# Patient Record
Sex: Female | Born: 1980
Health system: Southern US, Community
[De-identification: ages and names within clinical notes are randomized; demographics above are authoritative.]

## PROBLEM LIST (undated history)

## (undated) DIAGNOSIS — N760 Acute vaginitis: Secondary | ICD-10-CM

## (undated) DIAGNOSIS — N83209 Unspecified ovarian cyst, unspecified side: Secondary | ICD-10-CM

## (undated) DIAGNOSIS — B9689 Other specified bacterial agents as the cause of diseases classified elsewhere: Secondary | ICD-10-CM

## (undated) HISTORY — DX: Acute vaginitis: N76.0

## (undated) HISTORY — DX: Unspecified ovarian cyst, unspecified side: N83.209

## (undated) HISTORY — PX: LAPAROSCOPY: SHX197

## (undated) HISTORY — PX: APPENDECTOMY: SHX54

## (undated) HISTORY — DX: Other specified bacterial agents as the cause of diseases classified elsewhere: B96.89

## (undated) HISTORY — PX: WISDOM TOOTH EXTRACTION: SHX21

---

## 2014-12-22 DIAGNOSIS — R0789 Other chest pain: Secondary | ICD-10-CM | POA: Insufficient documentation

## 2015-09-09 DIAGNOSIS — T192XXA Foreign body in vulva and vagina, initial encounter: Secondary | ICD-10-CM | POA: Diagnosis not present

## 2015-09-11 MED FILL — PHENTERMINE 30 MG CAPSULE: 30 | 30 days supply | Qty: 30 | Fill #0

## 2015-09-11 MED FILL — metroNIDAZOLE 500 MG TABS: 500 | 7 days supply | Qty: 14 | Fill #0

## 2015-10-08 MED FILL — metroNIDAZOLE 500 MG TABS: 500 | 7 days supply | Qty: 14 | Fill #1

## 2015-11-02 MED FILL — metroNIDAZOLE 500 MG TABS: 500 | 7 days supply | Qty: 14 | Fill #2

## 2015-12-21 MED FILL — metroNIDAZOLE 500 MG TABS: 500 | 7 days supply | Qty: 14 | Fill #3

## 2016-01-21 ENCOUNTER — Encounter: Payer: Self-pay | Admitting: Obstetrics and Gynecology

## 2016-03-01 ENCOUNTER — Encounter: Payer: Self-pay | Admitting: Obstetrics and Gynecology

## 2016-03-01 ENCOUNTER — Ambulatory Visit (INDEPENDENT_AMBULATORY_CARE_PROVIDER_SITE_OTHER): Payer: 59 | Admitting: Obstetrics and Gynecology

## 2016-03-01 VITALS — BP 108/72 | HR 91 | Ht 67.0 in | Wt 211.9 lb

## 2016-03-01 DIAGNOSIS — Z8742 Personal history of other diseases of the female genital tract: Secondary | ICD-10-CM | POA: Insufficient documentation

## 2016-03-01 DIAGNOSIS — E669 Obesity, unspecified: Secondary | ICD-10-CM | POA: Diagnosis not present

## 2016-03-01 DIAGNOSIS — Z3049 Encounter for surveillance of other contraceptives: Secondary | ICD-10-CM

## 2016-03-01 DIAGNOSIS — N946 Dysmenorrhea, unspecified: Secondary | ICD-10-CM | POA: Insufficient documentation

## 2016-03-01 DIAGNOSIS — N920 Excessive and frequent menstruation with regular cycle: Secondary | ICD-10-CM

## 2016-03-01 DIAGNOSIS — Z9889 Other specified postprocedural states: Secondary | ICD-10-CM | POA: Diagnosis not present

## 2016-03-01 DIAGNOSIS — Z6833 Body mass index (BMI) 33.0-33.9, adult: Secondary | ICD-10-CM | POA: Insufficient documentation

## 2016-03-01 DIAGNOSIS — Z8619 Personal history of other infectious and parasitic diseases: Secondary | ICD-10-CM | POA: Insufficient documentation

## 2016-03-01 DIAGNOSIS — R102 Pelvic and perineal pain: Secondary | ICD-10-CM | POA: Diagnosis not present

## 2016-03-01 DIAGNOSIS — Z113 Encounter for screening for infections with a predominantly sexual mode of transmission: Secondary | ICD-10-CM | POA: Insufficient documentation

## 2016-03-01 DIAGNOSIS — Z3040 Encounter for surveillance of contraceptives, unspecified: Secondary | ICD-10-CM | POA: Insufficient documentation

## 2016-03-01 DIAGNOSIS — Z01419 Encounter for gynecological examination (general) (routine) without abnormal findings: Secondary | ICD-10-CM | POA: Diagnosis not present

## 2016-03-01 LAB — POCT URINALYSIS DIPSTICK
Bilirubin, UA: NEGATIVE
GLUCOSE UA: NEGATIVE
Ketones, UA: NEGATIVE
LEUKOCYTES UA: NEGATIVE
NITRITE UA: NEGATIVE
PH UA: 6
Protein, UA: NEGATIVE
RBC UA: NEGATIVE
Spec Grav, UA: 1.02
UROBILINOGEN UA: NEGATIVE

## 2016-03-01 NOTE — Addendum Note (Signed)
Addended by: Marchelle FolksMILLER, Zaidyn Claire G on: 03/01/2016 04:57 PM   Modules accepted: Orders

## 2016-03-01 NOTE — Progress Notes (Signed)
ANNUAL PREVENTATIVE CARE GYN  ENCOUNTER NOTE  Subjective:       Lisa Norman is a 35 y.o. G0P0000 female here for a routine annual gynecologic exam.  Current complaints:  1.  Recurrent bv: history of recurrent bacterial vaginosis treated with MetroGel, Flagyl by mouth, and alternating regimen of MetroGel 1 week followed by Flagyl by mouth 1 week followed by MetroGel the third week without resolution of symptomatology. Patient reports occasional douching. She does use condoms for contraception, and in particular lambskin condoms. She is uncertain if the prophylactic is contributing to the symptoms or semen or just pH changes that fluctuate.  2. Change in menses: Since age 35 patient has noted cycles to the heavier and associated with more clots. She notes increasing dysmenorrhea requiring ibuprofen 800 mg twice a day. Pelvic cramps are described as central cramping along with low back pain associated with radiation into the buttocks and thighs. She does not report any deep thrusting dyspareunia. Patient does have 2 sisters who also have painful periods. There is not: Diagnosis of endometriosis in the family There is a family history of stroke in mom who had first stroke at age 37 and subsequently had 2 brothers. She also has maternal aunt with a stroke as well as maternal grandmother. She is not inclined to use hormonal contraception.  3. Remote history of PID diagnosed at age 35; status post laparotomy, suspected appendicitis, but actually finding PID; patient was counseled back: Regarding potential impact on chronic pelvic pain and or infertility. She has intermittently not use contraception without conceiving. Currently she is with a monogamous partner     Gynecologic History Patient's last menstrual period was 02/21/2016 (approximate). Contraception: condoms Last Pap: 2016. Results were: normal Last mammogram: n/a.   Obstetric History OB History  Gravida Para Term Preterm AB Living   0 0 0 0 0 0  SAB TAB Ectopic Multiple Live Births  0 0 0 0 0        Past Medical History:  Diagnosis Date  . Bacterial vaginosis   . Ovarian cyst     Past Surgical History:  Procedure Laterality Date  . LAPAROSCOPY      No current outpatient prescriptions on file prior to visit.   No current facility-administered medications on file prior to visit.     No Known Allergies  Social History   Social History  . Marital status: Single    Spouse name: N/A  . Number of children: N/A  . Years of education: N/A   Occupational History  . Not on file.   Social History Main Topics  . Smoking status: Not on file  . Smokeless tobacco: Not on file  . Alcohol use Not on file  . Drug use: Unknown  . Sexual activity: Not on file   Other Topics Concern  . Not on file   Social History Narrative  . No narrative on file    No family history on file.  The following portions of the patient's history were reviewed and updated as appropriate: allergies, current medications, past family history, past medical history, past social history, past surgical history and problem list.  Review of Systems ROS Review of Systems - General ROS: negative for - chills, fatigue, fever, hot flashes, night sweats. POSITIVE-weight gain Psychological ROS: negative for - anxiety, decreased libido, depression, mood swings, physical abuse or sexual abuse Ophthalmic ROS: negative for - blurry vision, eye pain or loss of vision ENT ROS: negative for - headaches, hearing  change, visual changes or vocal changes Allergy and Immunology ROS: negative for - hives, itchy/watery eyes or seasonal allergies Hematological and Lymphatic ROS: negative for - bleeding problems, bruising, swollen lymph nodes or weight loss Endocrine ROS: negative for - galactorrhea, hair pattern changes, hot flashes, malaise/lethargy, mood swings, palpitations, polydipsia/polyuria, skin changes Breast ROS: negative for - new or changing  breast lumps or nipple discharge Respiratory ROS: negative for - cough or shortness of breath Cardiovascular ROS: negative for - chest pain, irregular heartbeat, palpitations or shortness of breath Gastrointestinal ROS: no abdominal pain, change in bowel habits, or black or bloody stools Genito-Urinary ROS: no dysuria, trouble voiding, or hematuria Musculoskeletal ROS: negative for - joint pain or joint stiffness Neurological ROS: negative for - bowel and bladder control changes Dermatological ROS: negative for rash and skin lesion changes   Objective:   BP 108/72   Pulse 91   Ht 5\' 7"  (1.702 m)   Wt 211 lb 14.4 oz (96.1 kg)   LMP 02/21/2016 (Approximate)   BMI 33.19 kg/m  CONSTITUTIONAL: Well-developed, well-nourished female in no acute distress.  PSYCHIATRIC: Normal mood and affect. Normal behavior. Normal judgment and thought content. NEUROLGIC: Alert and oriented to person, place, and time. Normal muscle tone coordination. No cranial nerve deficit noted. HENT:  Normocephalic, atraumatic, External right and left ear normal. Oropharynx is clear and moist EYES: Conjunctivae and EOM are normal. Pupils are equal, round, and reactive to light. No scleral icterus.  NECK: Normal range of motion, supple, no masses.  Normal thyroid.  SKIN: Skin is warm and dry. No rash noted. Not diaphoretic. No erythema. No pallor. CARDIOVASCULAR: Normal heart rate noted, regular rhythm, no murmur. RESPIRATORY: Clear to auscultation bilaterally. Effort and breath sounds normal, no problems with respiration noted. BREASTS: Symmetric in size. No masses, skin changes, nipple drainage, or lymphadenopathy. ABDOMEN: Soft, normal bowel sounds, no distention noted.  No tenderness, rebound or guarding.  BLADDER: Normal PELVIC:  External Genitalia: Normal  BUS: Normal  Vagina: Normal  Cervix: No lesions; cervical motion tenderness 1/4  Uterus: Mid plane, size difficult to assess due to involuntary  guarding  Adnexa: Difficult to assess due to guarding  RV: External Exam NormaI  MUSCULOSKELETAL: Normal range of motion. No tenderness.  No cyanosis, clubbing, or edema.  2+ distal pulses. LYMPHATIC: No Axillary, Supraclavicular, or Inguinal Adenopathy.    Assessment:   Annual gynecologic examination 35 y.o. Contraception: condoms Obesity 1 Menorrhagia, unclear etiology Dysmenorrhea History of PID, age 34 Suboptimal pelvic exam due to patient guarding History of recurrent BV  Plan:  Pap: Pap Co Test Mammogram: due age 47 Stool Guaiac Testing:  Not Indicated Labs: lipid vit d fbs a1c tsh STI screen Routine preventative health maintenance measures emphasized: Exercise/Diet/Weight control, Tobacco Warnings, Alcohol/Substance use risks and Safe Sex  Pelvic ultrasound to assess uterus and adnexa Nu swab to assess for BV and other pathogens Return as needed if increased symptoms develop Hormonal contraceptives were discussed with regard to beneficial effects of decreasing menorrhagia and dysmenorrhea. Recommend patient to consider Mirena IUD trial Return to Clinic - 1 [address] [place], [place]  Herold Harms, MD  Note: This dictation was prepared with Dragon dictation along with smaller phrase technology. Any transcriptional errors that result from this process are unintentional.

## 2016-03-01 NOTE — Patient Instructions (Addendum)
1. Pap smear is done 2. Self breast use encouraged 3. Healthy eating with exercise and controlled weight loss 4. Screening labs are ordered 5. Contraception-condoms 6. Return in 1 year 7. Ultrasound Scheduled. I-Within palpable 6.x results will be made available

## 2016-03-02 NOTE — Addendum Note (Signed)
Addended by: Marchelle FolksMILLER, Alika Saladin G on: 03/02/2016 11:57 AM   Modules accepted: Orders

## 2016-03-03 DIAGNOSIS — Z01419 Encounter for gynecological examination (general) (routine) without abnormal findings: Secondary | ICD-10-CM | POA: Diagnosis not present

## 2016-03-03 DIAGNOSIS — Z113 Encounter for screening for infections with a predominantly sexual mode of transmission: Secondary | ICD-10-CM | POA: Diagnosis not present

## 2016-03-03 LAB — URINE CULTURE: Organism ID, Bacteria: NO GROWTH

## 2016-03-04 LAB — NUSWAB VAGINITIS PLUS (VG+)
CHLAMYDIA TRACHOMATIS, NAA: NEGATIVE
Candida albicans, NAA: POSITIVE — AB
Candida glabrata, NAA: NEGATIVE
NEISSERIA GONORRHOEAE, NAA: NEGATIVE
Trich vag by NAA: NEGATIVE

## 2016-03-04 LAB — LIPID PANEL
CHOLESTEROL TOTAL: 127 mg/dL (ref 100–199)
Chol/HDL Ratio: 2.6 ratio units (ref 0.0–4.4)
HDL: 48 mg/dL (ref >39)
LDL Calculated: 68 mg/dL (ref 0–99)
TRIGLYCERIDES: 56 mg/dL (ref 0–149)
VLDL CHOLESTEROL CAL: 11 mg/dL (ref 5–40)

## 2016-03-04 LAB — GLUCOSE, RANDOM: Glucose: 89 mg/dL (ref 65–99)

## 2016-03-04 LAB — HSV(HERPES SIMPLEX VRS) I + II AB-IGG
HSV 1 GLYCOPROTEIN G AB, IGG: 35.6 {index} — AB (ref 0.00–0.90)
HSV 2 GLYCOPROTEIN G AB, IGG: 3.67 {index} — AB (ref 0.00–0.90)

## 2016-03-04 LAB — HEMOGLOBIN A1C
ESTIMATED AVERAGE GLUCOSE: 105 mg/dL
Hgb A1c MFr Bld: 5.3 % (ref 4.8–5.6)

## 2016-03-04 LAB — VITAMIN D 25 HYDROXY (VIT D DEFICIENCY, FRACTURES): VIT D 25 HYDROXY: 12.4 ng/mL — AB (ref 30.0–100.0)

## 2016-03-04 LAB — TSH: TSH: 3.56 u[IU]/mL (ref 0.450–4.500)

## 2016-03-05 LAB — PAP IG AND HPV HIGH-RISK
HPV, high-risk: NEGATIVE
PAP Smear Comment: 0

## 2016-03-07 ENCOUNTER — Telehealth: Payer: Self-pay | Admitting: Obstetrics and Gynecology

## 2016-03-07 NOTE — Telephone Encounter (Signed)
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 NO current infection. Aware to take vit d 1000 qd.

## 2016-03-07 NOTE — Telephone Encounter (Signed)
Patient said she saw something in her  mychart and has a question

## 2016-03-08 LAB — HEPATITIS B SURFACE ANTIGEN: Hepatitis B Surface Ag: NEGATIVE

## 2016-03-08 LAB — HEPATITIS C ANTIBODY: Hep C Virus Ab: <0.1 s/co ratio (ref 0.0–0.9)

## 2016-03-08 LAB — RPR: RPR: NONREACTIVE

## 2016-03-08 LAB — HSV(HERPES SIMPLEX VRS) I + II AB-IGM: HSVI/II Comb IgM: 0.92 Ratio — ABNORMAL HIGH (ref 0.00–0.90)

## 2016-03-08 LAB — HIV ANTIBODY (ROUTINE TESTING W REFLEX): HIV SCREEN 4TH GENERATION: NONREACTIVE

## 2016-04-06 ENCOUNTER — Emergency Department
Admission: EM | Admit: 2016-04-06 | Discharge: 2016-04-06 | Disposition: A | Discharge status: Home / Self Care | Payer: 59 | Source: Home / Self Care | Attending: Family Medicine | Admitting: Family Medicine

## 2016-04-06 ENCOUNTER — Emergency Department (INDEPENDENT_AMBULATORY_CARE_PROVIDER_SITE_OTHER): Payer: 59

## 2016-04-06 ENCOUNTER — Encounter: Payer: Self-pay | Admitting: Emergency Medicine

## 2016-04-06 DIAGNOSIS — R05 Cough: Secondary | ICD-10-CM

## 2016-04-06 DIAGNOSIS — J069 Acute upper respiratory infection, unspecified: Secondary | ICD-10-CM | POA: Diagnosis not present

## 2016-04-06 DIAGNOSIS — R053 Chronic cough: Secondary | ICD-10-CM

## 2016-04-06 MED ORDER — IPRATROPIUM-ALBUTEROL 0.5-2.5 (3) MG/3ML IN SOLN
3.0000 mL | Freq: Four times a day (QID) | RESPIRATORY_TRACT | Status: DC
  Administered 2016-04-06 (×2): 3 mL via RESPIRATORY_TRACT

## 2016-04-06 MED ORDER — ALBUTEROL SULFATE HFA 108 (90 BASE) MCG/ACT IN AERS
1.0000 | INHALATION_SPRAY | Freq: Four times a day (QID) | RESPIRATORY_TRACT | 0 refills | Status: DC | PRN
Start: 2016-04-06 — End: 2016-07-05

## 2016-04-06 MED ORDER — BENZONATATE 100 MG PO CAPS: 100.0000 mg | ORAL_CAPSULE | Freq: Three times a day (TID) | ORAL | 0 refills | Status: DC

## 2016-04-06 MED ORDER — AZITHROMYCIN 250 MG PO TABS: 250.0000 mg | ORAL_TABLET | Freq: Every day | ORAL | 0 refills | Status: DC

## 2016-04-06 NOTE — ED Provider Notes (Signed)
CSN: [id number]     Arrival date & time 04/06/16  1904 History   First MD Initiated Contact with Patient 04/06/16 1924     Chief Complaint  Patient presents with  . Cough   (Consider location/radiation/quality/duration/timing/severity/associated sxs/prior Treatment) HPI  Lisa Norman is a 35 y.o. female presenting to UC with c/o 3 weeks of cough, congestion, chest tightness.  She notes symptoms initially started with subjective fever with hot and cold chills, congestion, fatigue, body aches and cough.  Pt notes fever and body aches have resolved but she still has an intermittent cough that causes her to feel short of breath at times.  She also notes it feels like her chest is tight.  She is concerned she may have the flu or pneumonia. Denies prior hx of pneumonia. Denies hx of asthma.  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 No recent travel. She did get her flu vaccine about 1 month ago.   Past Medical History:  Diagnosis Date  . Bacterial vaginosis   . Ovarian cyst    Past Surgical History:  Procedure Laterality Date  . LAPAROSCOPY     Family History  Problem Relation Age of Onset  . Breast cancer Neg Hx   . Ovarian cancer Neg Hx   . Colon cancer Neg Hx   . Diabetes Neg Hx   . Heart disease Neg Hx    Social History  Substance Use Topics  . Smoking status: Former Smoker    Types: Cigarettes  . Smokeless tobacco: Current User    Types: Snuff  . Alcohol use No   OB History    Gravida Para Term Preterm AB Living   0 0 0 0 0 0   SAB TAB Ectopic Multiple Live Births   0 0 0 0 0     Review of Systems  Constitutional: Positive for chills ( initially), fatigue and fever ( initially).  HENT: Positive for congestion and rhinorrhea. Negative for ear pain, sore throat, trouble swallowing and voice change.   Respiratory: Positive for cough and chest tightness. Negative for shortness of breath and wheezing.   Cardiovascular: Negative for chest pain and  palpitations.  Gastrointestinal: Negative for abdominal pain, diarrhea, nausea and vomiting.  Musculoskeletal: Positive for arthralgias and myalgias. Negative for back pain.       Body aches, initially   Skin: Negative for rash.    Allergies  Patient has no known allergies.  Home Medications   Prior to Admission medications   Medication Sig Start Date End Date Taking? Authorizing Provider  albuterol (PROVENTIL HFA;VENTOLIN HFA) 108 (90 Base) MCG/ACT inhaler Inhale 1-2 puffs into the lungs every 6 (six) hours as needed for wheezing or shortness of breath. 04/06/16   Junius FinnerErin O'Malley, PA-C  azithromycin (ZITHROMAX) 250 MG tablet Take 1 tablet (250 mg total) by mouth daily. Take first 2 tablets together, then 1 every day until finished. 04/06/16   Junius FinnerErin O'Malley, PA-C  benzonatate (TESSALON) 100 MG capsule Take 1-2 capsules (100-200 mg total) by mouth every 8 (eight) hours. 04/06/16   Junius FinnerErin O'Malley, PA-C   Meds Ordered and Administered this Visit  Medications - No data to display  BP 119/80 (BP Location: Right Arm)   Pulse 82   Temp 98.5 F (36.9 C) (Oral)   Wt 210 lb (95.3 kg)   LMP 03/23/2016 (Approximate)   SpO2 100%   BMI 32.89 kg/m  No data found.   Physical Exam  Constitutional: She appears  well-developed and well-nourished. No distress.  HENT:  Head: Normocephalic and atraumatic.  Right Ear: Tympanic membrane normal.  Left Ear: Tympanic membrane normal.  Nose: Nose normal.  Mouth/Throat: Uvula is midline, oropharynx is clear and moist and mucous membranes are normal.  Eyes: Conjunctivae are normal. No scleral icterus.  Neck: Normal range of motion. Neck supple.  Cardiovascular: Normal rate, regular rhythm and normal heart sounds.   Pulmonary/Chest: Effort normal. No respiratory distress. She has no wheezes. She has rhonchi. She has no rales.  Faint rhonchi in lower lung fields bilaterally. No respiratory distress.  Abdominal: Soft. She exhibits no distension. There is no  tenderness.  Musculoskeletal: Normal range of motion.  Neurological: She is alert.  Skin: Skin is warm and dry. She is not diaphoretic.  Nursing note and vitals reviewed.   Urgent Care Course   Clinical Course     Procedures (including critical care time)  Labs Review Labs Reviewed - No data to display  Imaging Review Dg Chest 2 View  Result Date: 04/06/2016 CLINICAL DATA:  Productive cough EXAM: CHEST  2 VIEW COMPARISON:  None. FINDINGS: The heart size and mediastinal contours are within normal limits. Both lungs are clear. The visualized skeletal structures are unremarkable. IMPRESSION: No active cardiopulmonary disease. Electronically Signed   By: Jasmine PangKim  Fujinaga M.D.   On: 04/06/2016 19:43    MDM   1. Upper respiratory tract infection, unspecified type   2. Persistent cough for 3 weeks or longer    Pt c/o 3 weeks of persistent cough, which started out as flu-like symptoms. No hx of asthma.  O2 Sat is 100% on RA. No respiratory distress noted on exam. Lungs: faint diffuse rhonchi in lower lung fields.  CXR: no evidence of pneumonia. Reassured pt no signs of pneumonia. Due to duration of cough, will cover for atypical bacteria.  Rx: Azithromycin, tessalon and albuterol inhaler Home care instructions provided. Encouraged f/u with PCP in 1 week if not improving, sooner if worsening.     Junius FinnerErin O'Malley, PA-C 04/07/16 [phone]

## 2016-04-06 NOTE — ED Triage Notes (Signed)
Pt c/o cough, SOB and chest tightness x3 weeks. Afebrile.

## 2016-04-07 MED FILL — BENZONATATE 100 MG CAPSULE: 100 | 4 days supply | Qty: 21 | Fill #0

## 2016-04-07 MED FILL — VENTOLIN HFA 90 MCG INHALER: 108 (90 BAS | 16 days supply | Qty: 18 | Fill #0

## 2016-04-07 MED FILL — AZITHROMYCIN 250 MG TABLET: 250 | 5 days supply | Qty: 6 | Fill #0

## 2016-04-10 ENCOUNTER — Telehealth: Payer: Self-pay | Admitting: Emergency Medicine

## 2016-04-10 NOTE — Telephone Encounter (Signed)
Inquired about patient's status; encourage them to call with questions/concerns.  

## 2016-06-30 ENCOUNTER — Encounter: Payer: Self-pay | Admitting: Obstetrics and Gynecology

## 2016-07-05 ENCOUNTER — Ambulatory Visit (INDEPENDENT_AMBULATORY_CARE_PROVIDER_SITE_OTHER): Payer: 59 | Admitting: Obstetrics and Gynecology

## 2016-07-05 ENCOUNTER — Encounter: Payer: Self-pay | Admitting: Obstetrics and Gynecology

## 2016-07-05 VITALS — BP 113/74 | HR 78 | Ht 67.0 in | Wt 210.3 lb

## 2016-07-05 DIAGNOSIS — N898 Other specified noninflammatory disorders of vagina: Secondary | ICD-10-CM | POA: Diagnosis not present

## 2016-07-05 DIAGNOSIS — R102 Pelvic and perineal pain: Secondary | ICD-10-CM

## 2016-07-05 LAB — POCT URINALYSIS DIPSTICK
Bilirubin, UA: NEGATIVE
GLUCOSE UA: NEGATIVE
KETONES UA: NEGATIVE
Leukocytes, UA: NEGATIVE
Nitrite, UA: NEGATIVE
Protein, UA: NEGATIVE
SPEC GRAV UA: 1.02
Urobilinogen, UA: NEGATIVE
pH, UA: 6.5

## 2016-07-05 NOTE — Progress Notes (Signed)
Chief complaint: 1. Vaginal discharge 2. Pelvic pressure 3. Pelvic pain  36 year old female P0 with history of chronic recurrent bacterial vaginosis, chronic pelvic pain with diagnosis of PID in the past, who presents for evaluation of vaginal discharge over several weeks with increased pelvic pressure. She denies UTI symptoms. Bowel function is normal. She denies fevers chills or sweats. Patient reports the discharge to not have any significant odor. She does not report itching or burning.  Past medical history, past surgical history, problem list, medications, and allergies are reviewed  OBJECTIVE: BP 113/74   Pulse 78   Ht 5\' 7"  (1.702 m)   Wt 210 lb 4.8 oz (95.4 kg)   LMP 06/17/2015 (Exact Date)   BMI 32.94 kg/m  Pleasant female in no acute distress. Alert and oriented. Abdomen: Soft, nontender without organomegaly. No peritoneal signs. Pelvic exam: External genitalia-normal BUS-normal Vagina-yellow brown discharge in vault, minimal; no malodor Cervix-1/4 cervical motion tenderness Uterus-midplane, top normal size, 1/4 tender Adnexa-nonpalpable nontender Rectovaginal-normal external exam  PROCEDURE: Wet prep Normal saline-moderate white blood cell; no significant clue cells; no Trichomonas KOH-no yeast; negative whiff test  ASSESSMENT: 1. Vaginal discharge 2. Pelvic pressure 3. History of chronic pelvic pain and has history of PID without exacerbation of pain  PLAN: 1. Wet prep 2. Nu swab of vagina 3. Urinalysis and urine culture 4. Results of testing will be made available; no treatment at this time 5. Return for annual exam as scheduled  A total of 15 minutes were spent face-to-face with the patient during this encounter and over half of that time dealt with counseling and coordination of care.  Herold HarmsMartin A Malone Vanblarcom, MD  Note: This dictation was prepared with Dragon dictation along with smaller phrase technology. Any transcriptional errors that result from this  process are unintentional.

## 2016-07-05 NOTE — Patient Instructions (Signed)
1. Wet prep is negative for evidence of infection 2. Nu swab is obtained to assess for vaginitis 3. Urinalysis and urine culture are obtained to rule out bladder infection 4. Results from testing will be made available. 5. Return as needed for gynecologic issues or annually for physical

## 2016-07-06 ENCOUNTER — Encounter: Payer: Self-pay | Admitting: Obstetrics and Gynecology

## 2016-07-07 LAB — URINE CULTURE: Organism ID, Bacteria: NO GROWTH

## 2016-07-08 ENCOUNTER — Telehealth: Payer: Self-pay

## 2016-07-08 LAB — NUSWAB BV AND CANDIDA, NAA
CANDIDA ALBICANS, NAA: NEGATIVE
CANDIDA GLABRATA, NAA: NEGATIVE

## 2016-07-08 MED ORDER — FLUCONAZOLE 150 MG PO TABS: 150.0000 mg | ORAL_TABLET | Freq: Every day | ORAL | 1 refills | Status: DC

## 2016-07-08 MED FILL — FLUCONAZOLE 150 MG TAB: 150 | 1 days supply | Qty: 1 | Fill #0

## 2016-07-08 NOTE — Telephone Encounter (Signed)
-----   Message from Herold HarmsMartin A Defrancesco, MD sent at 07/08/2016 11:26 AM EST ----- Please notify - Abnormal Labs Please call in Diflucan 150 mg orally 1 dose; refill 1

## 2016-07-08 NOTE — Telephone Encounter (Signed)
Pt aware per vm. Med erx.  

## 2016-09-12 ENCOUNTER — Other Ambulatory Visit: Payer: Self-pay

## 2016-09-12 ENCOUNTER — Encounter: Payer: Self-pay | Admitting: Obstetrics and Gynecology

## 2016-09-12 MED ORDER — METRONIDAZOLE 500 MG PO TABS: 500.0000 mg | ORAL_TABLET | Freq: Two times a day (BID) | ORAL | 0 refills | Status: DC

## 2016-09-13 MED FILL — metroNIDAZOLE 500 MG TABS: 500 | 7 days supply | Qty: 14 | Fill #0

## 2016-10-10 ENCOUNTER — Encounter: Payer: Self-pay | Admitting: Obstetrics and Gynecology

## 2016-10-10 ENCOUNTER — Other Ambulatory Visit: Payer: Self-pay

## 2016-10-10 MED ORDER — FLUCONAZOLE 150 MG PO TABS: 150.0000 mg | ORAL_TABLET | Freq: Every day | ORAL | 1 refills | Status: DC

## 2016-10-10 MED FILL — FLUCONAZOLE 150 MG TABLET: 150 | 1 days supply | Qty: 1 | Fill #0

## 2016-10-20 ENCOUNTER — Encounter: Payer: Self-pay | Admitting: Emergency Medicine

## 2016-10-20 ENCOUNTER — Emergency Department
Admission: EM | Admit: 2016-10-20 | Discharge: 2016-10-20 | Disposition: A | Discharge status: Home / Self Care | Payer: 59 | Source: Home / Self Care | Attending: Family Medicine | Admitting: Family Medicine

## 2016-10-20 ENCOUNTER — Emergency Department (INDEPENDENT_AMBULATORY_CARE_PROVIDER_SITE_OTHER): Payer: 59

## 2016-10-20 DIAGNOSIS — R51 Headache: Secondary | ICD-10-CM | POA: Diagnosis not present

## 2016-10-20 DIAGNOSIS — R519 Headache, unspecified: Secondary | ICD-10-CM

## 2016-10-20 MED ORDER — DEXAMETHASONE SODIUM PHOSPHATE 10 MG/ML IJ SOLN
10.0000 mg | Freq: Once | INTRAMUSCULAR | Status: AC
  Administered 2016-10-20: 10 mg via INTRAMUSCULAR

## 2016-10-20 MED ORDER — METOCLOPRAMIDE HCL 5 MG/ML IJ SOLN
5.0000 mg | Freq: Once | INTRAMUSCULAR | Status: AC
  Administered 2016-10-20: 5 mg via INTRAMUSCULAR

## 2016-10-20 MED ORDER — ONDANSETRON HCL 4 MG PO TABS: 4.0000 mg | ORAL_TABLET | Freq: Four times a day (QID) | ORAL | 0 refills | Status: DC

## 2016-10-20 MED FILL — ONDANSETRON HCL 4 MG TABLET: 4 | 3 days supply | Qty: 12 | Fill #0

## 2016-10-20 NOTE — ED Provider Notes (Signed)
CSN: [id number]     Arrival date & time 5/24213086578/18  1340 History   First MD Initiated Contact with Patient 10/20/16 1355     Chief Complaint  Patient presents with  . Headache   (Consider location/radiation/quality/duration/timing/severity/associated sxs/prior Treatment) HPI Lisa Norman is a 36 y.o. female presenting to UC with c/o sudden onset Left sided headache that started earlier this morning after having intercourse with her husband.  She has sense developed photophobia, phonophobia, and pain with pressure in Left eye.  She has taken 2 Excedrin around 10AM w/o relief. She also tried warm and cool compresses. The cool compress did provide minimal relief.  Denies hx of migraines in the past. Denies recent head trauma. Denies numbness or weakness in arms or legs. Denies hx of HTN. She is not on any daily medications.  Family history:  She notes her mother has had 3 strokes, the first was was in her 30s.  She believes her sister has migraines that started when she was an adult.   Past Medical History:  Diagnosis Date  . Bacterial vaginosis   . Ovarian cyst    Past Surgical History:  Procedure Laterality Date  . LAPAROSCOPY     Family History  Problem Relation Age of Onset  . Breast cancer Neg Hx   . Ovarian cancer Neg Hx   . Colon cancer Neg Hx   . Diabetes Neg Hx   . Heart disease Neg Hx    Social History  Substance Use Topics  . Smoking status: Former Smoker    Types: Cigarettes  . Smokeless tobacco: Current User    Types: Snuff  . Alcohol use No   OB History    Gravida Para Term Preterm AB Living   0 0 0 0 0 0   SAB TAB Ectopic Multiple Live Births   0 0 0 0 0     Review of Systems  Constitutional: Negative for chills and fever.  HENT: Negative for congestion, ear pain, sore throat, trouble swallowing and voice change.   Eyes: Positive for photophobia, pain and visual disturbance.  Respiratory: Negative for cough and shortness of breath.   Cardiovascular:  Negative for chest pain and palpitations.  Gastrointestinal: Positive for nausea (minimal). Negative for abdominal pain, diarrhea and vomiting.  Musculoskeletal: Positive for neck pain (Left side sore). Negative for arthralgias, back pain and myalgias.  Skin: Negative for rash.  Neurological: Positive for headaches. Negative for dizziness and light-headedness.    Allergies  Patient has no known allergies.  Home Medications   Prior to Admission medications   Medication Sig Start Date End Date Taking? Authorizing Provider  aspirin-acetaminophen-caffeine (EXCEDRIN MIGRAINE) [id number] MG tablet Take by mouth every 6 (six) hours as needed for headache.   Yes [provider]   Meds Ordered and Administered this Visit   Medications  dexamethasone (DECADRON) injection 10 mg (10 mg Intramuscular Given 10/20/16 1412)  metoCLOPramide (REGLAN) injection 5 mg (5 mg Intramuscular Given 10/20/16 1413)    BP 127/83 (BP Location: Left Arm)   Pulse 79   Temp 99.1 F (37.3 C) (Oral)   Ht 5\' 7"  (1.702 m)   Wt 205 lb (93 kg)   LMP 10/15/2016 (Exact Date)   SpO2 99%   BMI 32.11 kg/m  No data found.   Physical Exam  Constitutional: She is oriented to person, place, and time. She appears well-developed and well-nourished.  Pt lying in darkened exam room. Appears uncomfortably but is alert and cooperative during  exam.  HENT:  Head: Normocephalic and atraumatic.  Right Ear: Tympanic membrane normal.  Left Ear: Tympanic membrane normal.  Nose: Nose normal.  Mouth/Throat: Uvula is midline, oropharynx is clear and moist and mucous membranes are normal.  Eyes: EOM are normal. Pupils are equal, round, and reactive to light. Right eye exhibits no discharge. Left eye exhibits no discharge.  Neck: Normal range of motion. Neck supple.  Mild tenderness to Left sider cervical paraspinal muscles.   Cardiovascular: Normal rate and regular rhythm.   Pulmonary/Chest: Effort normal and breath sounds  normal. No stridor. No respiratory distress. She has no wheezes. She has no rales.  Musculoskeletal: Normal range of motion.  Lymphadenopathy:    She has no cervical adenopathy.  Neurological: She is alert and oriented to person, place, and time.  CN II-XII in tact. Speech is clear. Alert to person, place, and time. Normal gait.   Skin: Skin is warm and dry. She is not diaphoretic.  Psychiatric: She has a normal mood and affect. Her behavior is normal.  Nursing note and vitals reviewed.   Urgent Care Course     Procedures (including critical care time)  Labs Review Labs Reviewed - No data to display  Imaging Review Ct Head Wo Contrast  Result Date: 10/20/2016 CLINICAL DATA:  36 year old female with a history of headache EXAM: CT HEAD WITHOUT CONTRAST TECHNIQUE: Contiguous axial images were obtained from the base of the skull through the vertex without intravenous contrast. COMPARISON:  None. FINDINGS: Brain: No acute intracranial hemorrhage. No midline shift or mass effect. Gray-white differentiation maintained. Unremarkable appearance of the ventricular system. Vascular: Unremarkable. Skull: No acute fracture.  No aggressive bone lesion identified. Sinuses/Orbits: Unremarkable appearance of the orbits. Mastoid air cells clear. No middle ear effusion. No significant sinus disease. Other: None IMPRESSION: No CT evidence of acute intracranial abnormality. Electronically Signed   By: Gilmer Mor D.O.   On: 10/20/2016 14:49     MDM   1. Left-sided headache    Pt c/o sudden onset severe Left side headache with photophobia and mild nausea. No prior hx of migraines or severe HAs. Normal exam. No neuro deficits.  Concern for [facility].   CT head: no acute intracranial abnormality   Tx in UC: Decadron 10mg  IM and Reglan 5mg  IM  Pain improved in UC from 8/10 to 6/10.  Discussed going to emergency department for additional pain treatment and observation vs resting at home. Pt would like to try  resting at home first.  Discussed symptoms that warrant emergent care in the ED. Home care instructions provided. F/u with PCP for ongoing care.    Junius Finner, PA-C 10/20/16 [phone]

## 2016-10-20 NOTE — Discharge Instructions (Signed)
° °  Be sure to rest in a cool dark room, stay well hydrated with at least 6-8 8oz glasses of water a day.  If symptoms worsen, please do not hesitate to call 911 or go to closest emergency department for further evaluation and treatment.

## 2016-10-20 NOTE — ED Triage Notes (Signed)
Left side headache, throbbing and pressure behind left eye.

## 2016-10-21 ENCOUNTER — Telehealth: Payer: Self-pay | Admitting: Emergency Medicine

## 2016-10-21 NOTE — Telephone Encounter (Signed)
Hope she is better and headache has resolved have a great weekend, call if needed.

## 2016-10-25 ENCOUNTER — Encounter: Payer: Self-pay | Admitting: Obstetrics and Gynecology

## 2016-10-26 ENCOUNTER — Encounter: Payer: Self-pay | Admitting: Obstetrics and Gynecology

## 2016-10-26 ENCOUNTER — Ambulatory Visit (INDEPENDENT_AMBULATORY_CARE_PROVIDER_SITE_OTHER): Payer: 59 | Admitting: Obstetrics and Gynecology

## 2016-10-26 ENCOUNTER — Telehealth: Payer: Self-pay

## 2016-10-26 VITALS — BP 112/78 | HR 73 | Ht 67.0 in | Wt 203.6 lb

## 2016-10-26 DIAGNOSIS — N766 Ulceration of vulva: Secondary | ICD-10-CM | POA: Diagnosis not present

## 2016-10-26 DIAGNOSIS — Z202 Contact with and (suspected) exposure to infections with a predominantly sexual mode of transmission: Secondary | ICD-10-CM

## 2016-10-26 DIAGNOSIS — R102 Pelvic and perineal pain: Secondary | ICD-10-CM

## 2016-10-26 DIAGNOSIS — L989 Disorder of the skin and subcutaneous tissue, unspecified: Secondary | ICD-10-CM | POA: Diagnosis not present

## 2016-10-26 DIAGNOSIS — Z8619 Personal history of other infectious and parasitic diseases: Secondary | ICD-10-CM

## 2016-10-26 DIAGNOSIS — N898 Other specified noninflammatory disorders of vagina: Secondary | ICD-10-CM | POA: Insufficient documentation

## 2016-10-26 DIAGNOSIS — B009 Herpesviral infection, unspecified: Secondary | ICD-10-CM | POA: Diagnosis not present

## 2016-10-26 MED ORDER — ACYCLOVIR 400 MG PO TABS: 400.0000 mg | ORAL_TABLET | Freq: Three times a day (TID) | ORAL | 6 refills | Status: AC

## 2016-10-26 MED ORDER — METRONIDAZOLE 500 MG PO TABS: 500.0000 mg | ORAL_TABLET | Freq: Two times a day (BID) | ORAL | 0 refills | Status: AC

## 2016-10-26 MED FILL — ACYCLOVIR 400 MG TABLET: 400 | 5 days supply | Qty: 15 | Fill #0

## 2016-10-26 MED FILL — metroNIDAZOLE 500 MG TABS: 500 | 7 days supply | Qty: 14 | Fill #0

## 2016-10-26 NOTE — Telephone Encounter (Signed)
Spoke with pt.  Answered all questions.

## 2016-10-26 NOTE — Patient Instructions (Addendum)
1. Vulvar biopsy 2 is taken 2. Cultures for surgery transmitted infections are taken 3. Flagyl 500 mg twice a day is prescribed for bacterial vaginosis 4. Acyclovir 400 mg 3 times a day for 5 days is prescribed for possible HSV 5. Return in 1 week for follow-up on biopsies and culture results

## 2016-10-26 NOTE — Progress Notes (Signed)
GYN ENCOUNTER NOTE  Subjective:       Lisa Norman is a 36 y.o. G0P0000 female is here for gynecologic evaluation of the following issues:  1. Vaginal discharge 2. Vulvar ulcer 3. Pelvic pain  Patient got involved in a new relationship 3 months ago. 3 weeks ago she started having sex, unprotected, and very frequent. She has developed pelvic pain, vaginal discharge, and a sore noted at the introitus. She denies fever, chills, sweats, UTI symptoms. Review of lab data from prior testing is notable for a borderline elevated HSV 1/2 IgM antibody result from October 2017; no subsequent follow-up titers had been obtained after that testing. Patient is not aware of any vulvar or vaginal ulcers having occurred in the past. She is unaware of being exposed to anyone with active herpes.  Gynecologic History Patient's last menstrual period was 10/15/2016 (exact date).  Obstetric History OB History  Gravida Para Term Preterm AB Living  0 0 0 0 0 0  SAB TAB Ectopic Multiple Live Births  0 0 0 0 0        Past Medical History:  Diagnosis Date  . Bacterial vaginosis   . Ovarian cyst     Past Surgical History:  Procedure Laterality Date  . LAPAROSCOPY      Current Outpatient Prescriptions on File Prior to Visit  Medication Sig Dispense Refill  . aspirin-acetaminophen-caffeine (EXCEDRIN MIGRAINE) 250-250-65 MG tablet Take by mouth every 6 (six) hours as needed for headache.    . ondansetron (ZOFRAN) 4 MG tablet Take 1 tablet (4 mg total) by mouth every 6 (six) hours. 12 tablet 0   No current facility-administered medications on file prior to visit.     No Known Allergies  Social History   Social History  . Marital status: Single    Spouse name: N/A  . Number of children: N/A  . Years of education: N/A   Occupational History  . Not on file.   Social History Main Topics  . Smoking status: Former Smoker    Types: Cigarettes  . Smokeless tobacco: Never Used  . Alcohol  use No  . Drug use: No  . Sexual activity: Yes    Birth control/ protection: Condom   Other Topics Concern  . Not on file   Social History Narrative  . No narrative on file    Family History  Problem Relation Age of Onset  . Breast cancer Neg Hx   . Ovarian cancer Neg Hx   . Colon cancer Neg Hx   . Diabetes Neg Hx   . Heart disease Neg Hx     The following portions of the patient's history were reviewed and updated as appropriate: allergies, current medications, past family history, past medical history, past social history, past surgical history and problem list.  Review of Systems Review of Systems  Constitutional: Negative for chills, fever, malaise/fatigue and weight loss.  Respiratory: Negative.   Cardiovascular: Negative.   Gastrointestinal: Positive for abdominal pain. Negative for diarrhea, nausea and vomiting.  Genitourinary: Negative for dysuria, flank pain, frequency and urgency.       Vaginal discharge Vulvar lesion-white Multi-Papular lesion on right gluteus, new  Musculoskeletal: Negative for myalgias.  Skin: Positive for rash.  Neurological: Negative.   Endo/Heme/Allergies: Negative.   Psychiatric/Behavioral: Negative.     Objective:   BP 112/78   Pulse 73   Ht 5\' 7"  (1.702 m)   Wt 203 lb 9.6 oz (92.4 kg)   LMP 10/15/2016 (Exact  Date)   BMI 31.89 kg/m  CONSTITUTIONAL: Well-developed, well-nourished female in no acute distress.  HENT:  Normocephalic, atraumatic.  NECK: Not examined SKIN: Skin is warm and dry. No rash noted. Not diaphoretic. No erythema. No pallor. NEUROLGIC: Alert and oriented to person, place, and time. PSYCHIATRIC: Normal mood and affect. Normal behavior. Normal judgment and thought content. CARDIOVASCULAR:Not Examined RESPIRATORY: Not Examined BREASTS: Not Examined ABDOMEN: Soft, non distended; Non tender.  No Organomegaly. PELVIC:  External Genitalia: 5 x 7 mm ulceration at posterior fourchette/introitus, 3/4 tender;  bilateral inguinal lymph nodes are palpable (1 on each side), slightly tender; leukoplakia at posterior fourchette bordering on the ulcerated lesion  BUS: Normal  Vagina: Frothy white discharge, positive whiff test  Cervix: Mild cervical motion tenderness; no gross lesions  Uterus: Mid plane, top normal size, 2/4 tender  Adnexa: Normal; nonpalpable and nontender  RV: Normal external exam  Bladder: Nontender MUSCULOSKELETAL: Normal range of motion. No tenderness.  No cyanosis, clubbing, or edema.  PROCEDURE: Wet prep Normal saline-positive clue cells; few white blood cells; no Trichomonas KOH-no hyphae; positive whiff   Assessment:   1. STD exposure - Hepatitis B surface antigen - Hepatitis C antibody - Herpes simplex virus culture - HIV antibody - HSV(herpes simplex vrs) 1+2 ab-IgM - RPR - HSV(herpes simplex vrs) 1+2 ab-IgG - NuSwab Vaginitis Plus (VG+)  2. Vaginal discharge  3. Pelvic pain  4. Vulvar ulcer/Lesion-leukoplakia - Pathology  5. History of ELISA positive for herpes simplex virus; HSV 1/2 IgM borderline positive October 2017  6. Skin lesion, Right gluteus, multi-papular - Pathology     Plan:  1. Wet prep 2. Nu swab plus (GC/CT/BV/Candida/Trichomonas) 3. Posterior fourchette biopsy-ulcer 4. Right gluteus skin biopsy-multi-papular  Lesion 5. Metronidazole 500 mg twice a day for 7 days for bacterial vaginosis 6. Acyclovir 400 mg 3 times a day for 5 days for possible herpes genitalis outbreak 7. HSV culture-posterior fourchette 8. Return in 1 week for follow-up 9. Pathophysiology of herpes virus infections was reviewed; multiple questions addressed  A total of 25 minutes were spent face-to-face with the patient during this encounter and over half of that time involved counseling and coordination of care.  Herold Harms, MD  Note: This dictation was prepared with Dragon dictation along with smaller phrase technology. Any transcriptional errors  that result from this process are unintentional.

## 2016-10-28 LAB — PATHOLOGY

## 2016-10-28 LAB — NUSWAB VAGINITIS PLUS (VG+)
ATOPOBIUM VAGINAE: HIGH {score} — AB
BVAB 2: HIGH Score — AB
CHLAMYDIA TRACHOMATIS, NAA: NEGATIVE
Candida albicans, NAA: NEGATIVE
Candida glabrata, NAA: NEGATIVE
MEGASPHAERA 1: HIGH {score} — AB
NEISSERIA GONORRHOEAE, NAA: NEGATIVE
Trich vag by NAA: NEGATIVE

## 2016-10-29 LAB — HEPATITIS B SURFACE ANTIGEN: HEP B S AG: NEGATIVE

## 2016-10-29 LAB — RPR: RPR Ser Ql: NONREACTIVE

## 2016-10-29 LAB — HSV(HERPES SIMPLEX VRS) I + II AB-IGG
HSV 1 Glycoprotein G Ab, IgG: 36.5 index — ABNORMAL HIGH (ref 0.00–0.90)
HSV 2 Glycoprotein G Ab, IgG: 6.85 index — ABNORMAL HIGH (ref 0.00–0.90)

## 2016-10-29 LAB — HEPATITIS C ANTIBODY

## 2016-10-29 LAB — HIV ANTIBODY (ROUTINE TESTING W REFLEX): HIV Screen 4th Generation wRfx: NONREACTIVE

## 2016-10-29 LAB — HERPES SIMPLEX VIRUS CULTURE

## 2016-10-29 LAB — HSV(HERPES SIMPLEX VRS) I + II AB-IGM: HSVI/II COMB AB IGM: 1.09 ratio — AB (ref 0.00–0.90)

## 2016-10-31 ENCOUNTER — Encounter: Payer: Self-pay | Admitting: Obstetrics and Gynecology

## 2016-10-31 ENCOUNTER — Telehealth: Payer: Self-pay | Admitting: Obstetrics and Gynecology

## 2016-10-31 LAB — PATHOLOGY

## 2016-10-31 NOTE — Telephone Encounter (Signed)
Patient called wanting to speak with "Dr.De" or Crystal in regards to results on MyChart. Patient did not disclose any other information. Please advise.

## 2016-10-31 NOTE — Telephone Encounter (Signed)
Spoke with pt. Answered questions. Appt to see mad 11/02/2016.

## 2016-11-02 ENCOUNTER — Ambulatory Visit (INDEPENDENT_AMBULATORY_CARE_PROVIDER_SITE_OTHER): Payer: 59 | Admitting: Obstetrics and Gynecology

## 2016-11-02 ENCOUNTER — Encounter: Payer: Self-pay | Admitting: Obstetrics and Gynecology

## 2016-11-02 VITALS — BP 125/78 | HR 82 | Ht 67.0 in | Wt 204.4 lb

## 2016-11-02 DIAGNOSIS — N946 Dysmenorrhea, unspecified: Secondary | ICD-10-CM

## 2016-11-02 DIAGNOSIS — N766 Ulceration of vulva: Secondary | ICD-10-CM | POA: Diagnosis not present

## 2016-11-02 DIAGNOSIS — Z8742 Personal history of other diseases of the female genital tract: Secondary | ICD-10-CM

## 2016-11-02 DIAGNOSIS — B009 Herpesviral infection, unspecified: Secondary | ICD-10-CM | POA: Diagnosis not present

## 2016-11-02 DIAGNOSIS — R102 Pelvic and perineal pain unspecified side: Secondary | ICD-10-CM

## 2016-11-02 NOTE — Progress Notes (Signed)
Chief complaint: 1. Follow-up on vulvar biopsies 2. HSV 2 3. Pelvic pain  Patient presents for follow-up on above issues. Laboratory results have been reviewed:  Nuswab-positive for BV  HSV culture of genital ulcer is positive for HSV-2  HSV antibody titers-positive for HSV-1 IgG; positive for HSV-2 IgG  HIV negative  RPR nonreactive  Hep C negative  GC negative  Chlamydia negative    OBJECTIVE: BP 125/78   Pulse 82   Ht 5\' 7"  (1.702 m)   Wt 204 lb 6.4 oz (92.7 kg)   LMP 10/15/2016 (Exact Date)   BMI 32.01 kg/m  Pleasant female in no acute distress. Alert and oriented. Pelvic exam: External genitalia-healing posterior fourchette ulcer; biopsy sites healing; no active ulcers noted Vagina-no significant discharge Bimanual-not performed  ASSESSMENT: 1. Herpes genitalis infection, resolving 2. Bacterial vaginosis, treated 3. History of PID; history of dysmenorrhea and dyspareunia 4. Concern for fertility and advanced maternal age  PLAN: 1. Ongoing management of herpes genitalis reviewed 2. Begin prenatal vitamins daily 3. Patient is scheduled for laparoscopy with peritoneal biopsies and chromopertubation to assess for possible endometriosis, pelvic adhesive disease, and possible tubal obstruction which may impact fertility 4. Return for preoperative appointment prior to surgery  A total of 15 minutes were spent face-to-face with the patient during this encounter and over half of that time dealt with counseling and coordination of care.  Herold HarmsMartin A Wolfe Camarena, MD  Note: This dictation was prepared with Dragon dictation along with smaller phrase technology. Any transcriptional errors that result from this process are unintentional.

## 2016-11-02 NOTE — Patient Instructions (Signed)
1. Laparoscopy with peritoneal biopsies and chromopertubation is scheduled 2. Return in 1 week before surgery for preoperative appointment  Diagnostic Laparoscopy A diagnostic laparoscopy is a procedure to diagnose diseases in the abdomen. During the procedure, a thin, lighted, pencil-sized instrument called a laparoscope is inserted into the abdomen through an incision. The laparoscope allows your health care provider to look at the organs inside your body. Tell a health care provider about:  Any allergies you have.  All medicines you are taking, including vitamins, herbs, eye drops, creams, and over-the-counter medicines.  Any problems you or family members have had with anesthetic medicines.  Any blood disorders you have.  Any surgeries you have had.  Any medical conditions you have. What are the risks? Generally, this is a safe procedure. However, problems can occur, which may include:  Infection.  Bleeding.  Damage to other organs.  Allergic reaction to the anesthetics used during the procedure.  What happens before the procedure?  Do not eat or drink anything after midnight on the night before the procedure or as directed by your health care provider.  Ask your health care provider about: ? Changing or stopping your regular medicines. ? Taking medicines such as aspirin and ibuprofen. These medicines can thin your blood. Do not take these medicines before your procedure if your health care provider instructs you not to.  Plan to have someone take you home after the procedure. What happens during the procedure?  You may be given a medicine to help you relax (sedative).  You will be given a medicine to make you sleep (general anesthetic).  Your abdomen will be inflated with a gas. This will make your organs easier to see.  Small incisions will be made in your abdomen.  A laparoscope and other small instruments will be inserted into the abdomen through the  incisions.  A tissue sample may be removed from an organ in the abdomen for examination.  The instruments will be removed from the abdomen.  The gas will be released.  The incisions will be closed with stitches (sutures). What happens after the procedure? Your blood pressure, heart rate, breathing rate, and blood oxygen level will be monitored often until the medicines you were given have worn off. This information is not intended to replace advice given to you by your health care provider. Make sure you discuss any questions you have with your health care provider. Document Released: 08/22/2000 Document Revised: 09/24/2015 Document Reviewed: 12/27/2013 Elsevier Interactive Patient Education  2018 [employer].  Genital Herpes Genital herpes is a common sexually transmitted infection (STI) that is caused by a virus. The virus spreads from person to person through sexual contact. Infection can cause itching, blisters, and sores around the genitals or rectum. Symptoms may last several days and then go away This is called an outbreak. However, the virus remains in your body, so you may have more outbreaks in the future. The time between outbreaks varies and can be months or years. Genital herpes affects men and women. It is particularly concerning for pregnant women because the virus can be passed to the baby during delivery and can cause serious problems. Genital herpes is also a concern for people who have a weak disease-fighting (immune) system. What are the causes? This condition is caused by the herpes simplex virus (HSV) type 1 or type 2. The virus may spread through:  Sexual contact with an infected person, including vaginal, anal, and oral sex.  Contact with fluid from a  herpes sore.  The skin. This means that you can get herpes from an infected partner even if he or she does not have a visible sore or does not know that he or she is infected.  What increases the risk? You are more  likely to develop this condition if:  You have sex with many partners.  You do not use latex condoms during sex.  What are the signs or symptoms? Most people do not have symptoms (asymptomatic) or have mild symptoms that may be mistaken for other skin problems. Symptoms may include:  Small red bumps near the genitals, rectum, or mouth. These bumps turn into blisters and then turn into sores.  Flu-like symptoms, including: ? Fever. ? Body aches. ? Swollen lymph nodes. ? Headache.  Painful urination.  Pain and itching in the genital area or rectal area.  Vaginal discharge.  Tingling or shooting pain in the legs and buttocks.  Generally, symptoms are more severe and last longer during the first (primary) outbreak. Flu-like symptoms are also more common during the primary outbreak. How is this diagnosed? Genital herpes may be diagnosed based on:  A physical exam.  Your medical history.  Blood tests.  A test of a fluid sample (culture) from an open sore.  How is this treated? There is no cure for this condition, but treatment with antiviral medicines that are taken by mouth (orally) can do the following:  Speed up healing and relieve symptoms.  Help to reduce the spread of the virus to sexual partners.  Limit the chance of future outbreaks, or make future outbreaks shorter.  Lessen symptoms of future outbreaks.  Your health care provider may also recommend pain relief medicines, such as aspirin or ibuprofen. Follow these instructions at home: Sexual activity  Do not have sexual contact during active outbreaks.  Practice safe sex. Latex condoms and female condoms may help prevent the spread of the herpes virus. General instructions  Keep the affected areas dry and clean.  Take over-the-counter and prescription medicines only as told by your health care provider.  Avoid rubbing or touching blisters and sores. If you do touch blisters or sores: ? Wash your hands  thoroughly with soap and water. ? Do not touch your eyes afterward.  To help relieve pain or itching, you may take the following actions as directed by your health care provider: ? Apply a cold, wet cloth (cold compress) to affected areas 4-6 times a day. ? Apply a substance that protects your skin and reduces bleeding (astringent). ? Apply a gel that helps relieve pain around sores (lidocaine gel). ? Take a warm, shallow bath that cleans the genital area (sitz bath).  Keep all follow-up visits as told by your health care provider. This is important. How is this prevented?  Use condoms. Although anyone can get genital herpes during sexual contact, even with the use of a condom, a condom can provide some protection.  Avoid having multiple sexual partners.  Talk with your sexual partner about any symptoms either of you may have. Also, talk with your partner about any history of STIs.  Get tested for STIs before you have sex. Ask your partner to do the same.  Do not have sexual contact if you have symptoms of genital herpes. Contact a health care provider if:  Your symptoms are not improving with medicine.  Your symptoms return.  You have new symptoms.  You have a fever.  You have abdominal pain.  You have redness, swelling,  or pain in your eye.  You notice new sores on other parts of your body.  You are a woman and experience bleeding between menstrual periods.  You have had herpes and you become pregnant or plan to become pregnant. Summary  Genital herpes is a common sexually transmitted infection (STI) that is caused by the herpes simplex virus (HSV) type 1 or type 2.  These viruses are most often spread through sexual contact with an infected person.  You are more likely to develop this condition if you have sex with many partners or you have unprotected sex.  Most people do not have symptoms (asymptomatic) or have mild symptoms that may be mistaken for other skin  problems. Symptoms occur as outbreaks that may happen months or years apart.  There is no cure for this condition, but treatment with oral antiviral medicines can reduce symptoms, reduce the chance of spreading the virus to a partner, prevent future outbreaks, or shorten future outbreaks. This information is not intended to replace advice given to you by your health care provider. Make sure you discuss any questions you have with your health care provider. Document Released: 05/13/2000 Document Revised: 04/15/2016 Document Reviewed: 04/15/2016 Elsevier Interactive Patient Education  2017 [employer]

## 2016-11-15 ENCOUNTER — Other Ambulatory Visit: Payer: Self-pay

## 2016-11-15 ENCOUNTER — Encounter: Payer: Self-pay | Admitting: Obstetrics and Gynecology

## 2016-11-15 MED ORDER — METRONIDAZOLE 500 MG PO TABS: 500.0000 mg | ORAL_TABLET | Freq: Two times a day (BID) | ORAL | 0 refills | Status: DC

## 2016-11-15 MED FILL — metroNIDAZOLE 500 MG TABS: 500 | 7 days supply | Qty: 14 | Fill #0

## 2016-11-16 ENCOUNTER — Ambulatory Visit: Payer: 59 | Admitting: Physician Assistant

## 2016-11-16 ENCOUNTER — Encounter: Payer: Self-pay | Admitting: Obstetrics and Gynecology

## 2016-11-22 ENCOUNTER — Encounter: Payer: 59 | Admitting: Obstetrics and Gynecology

## 2016-11-22 ENCOUNTER — Encounter: Payer: Self-pay | Admitting: Obstetrics and Gynecology

## 2016-11-23 ENCOUNTER — Encounter: Payer: Self-pay | Admitting: Osteopathic Medicine

## 2016-11-23 ENCOUNTER — Ambulatory Visit (INDEPENDENT_AMBULATORY_CARE_PROVIDER_SITE_OTHER): Payer: 59 | Admitting: Osteopathic Medicine

## 2016-11-23 VITALS — BP 128/78 | HR 84 | Ht 67.0 in | Wt 207.0 lb

## 2016-11-23 DIAGNOSIS — G4452 New daily persistent headache (NDPH): Secondary | ICD-10-CM

## 2016-11-23 MED ORDER — KETOROLAC TROMETHAMINE 60 MG/2ML IM SOLN
30.0000 mg | Freq: Once | INTRAMUSCULAR | Status: AC
  Administered 2016-11-23: 30 mg via INTRAMUSCULAR

## 2016-11-23 MED ORDER — SUMATRIPTAN SUCCINATE 100 MG PO TABS: 50.0000 mg | ORAL_TABLET | ORAL | 3 refills | Status: DC | PRN

## 2016-11-23 MED ORDER — DEXAMETHASONE SODIUM PHOSPHATE 4 MG/ML IJ SOLN
4.0000 mg | Freq: Once | INTRAMUSCULAR | Status: AC
  Administered 2016-11-23: 4 mg via INTRAMUSCULAR

## 2016-11-23 MED ORDER — METOCLOPRAMIDE HCL 5 MG/ML IJ SOLN: 10.0000 mg | Freq: Once | INTRAMUSCULAR | Status: DC

## 2016-11-23 MED ORDER — METOCLOPRAMIDE HCL 5 MG/ML IJ SOLN
10.0000 mg | Freq: Once | INTRAVENOUS | Status: AC
  Administered 2016-11-23: 10 mg via INTRAMUSCULAR

## 2016-11-23 MED ORDER — KETOROLAC TROMETHAMINE 30 MG/ML IJ SOLN
30.0000 mg | Freq: Once | INTRAMUSCULAR | Status: DC
Start: 2016-11-23 — End: 2016-11-23

## 2016-11-23 NOTE — Progress Notes (Signed)
HPI: Lisa Norman is a 36 y.o. female  who presents to [facility] today, 11/23/16,  for chief complaint of: No chief complaint on file.   Headaches -  . Location: R side, never bilateral, extending down into the back  . Quality: pressure, no throbbing  . Duration: started 10/24/16 (see urgent care notes)  . Timing: more frequent recently over he past few weeks. Lingers a few days - this one first started 3 days ago  . Modifying factors: has tried excedrin OTC, worse w/o glasses . Assoc signs/symptoms: high BP w/ headache, no aura symptoms, +photphobia w. Headache. Of note, urgent care documentation mentions that this happened after sexual intercourse, patient states was several hours after sex rather than with/during.   Stress - (+)PHQ9 screening, see below. 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 Was recruited here, struggling with the move given how stressful work has been.  Blood pressure - rechecked in office and normal   Paperwork for FMLA - requests completion of this    Past medical, surgical, social and family history reviewed: Patient Active Problem List   Diagnosis Date Noted  . 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 STD exposure 10/26/2016  . Vaginal discharge 10/26/2016  . Vulvar ulcer 10/26/2016  . Skin lesion 10/26/2016  . Menorrhagia with regular cycle 03/01/2016  . Dysmenorrhea 03/01/2016  . History of ELISA positive for herpes simplex virus 03/01/2016  . History of PID 03/01/2016  . Screening examination for STD (sexually transmitted disease) 03/01/2016  . Pelvic pain 03/01/2016  . Class 1 obesity without serious comorbidity with body mass index (BMI) of 33.0 to 33.9 in adult 03/01/2016  . Contraceptive surveillance 03/01/2016  . Atypical chest pain 12/22/2014   Past Surgical History:   Procedure Laterality Date  . LAPAROSCOPY     Social History  Substance Use Topics  . Smoking status: Former Smoker    Types: Cigarettes  . Smokeless tobacco: Never Used  . Alcohol use No   Family History  Problem Relation Age of Onset  . Breast cancer Neg Hx   . Ovarian cancer Neg Hx   . Colon cancer Neg Hx   . Diabetes Neg Hx   . Heart disease Neg Hx      Current medication list and allergy/intolerance information reviewed:   Current Outpatient Prescriptions  Medication Sig Dispense Refill  . acyclovir (ZOVIRAX) 400 MG tablet Take 1 tablet (400 mg total) by mouth 3 (three) times daily. For 5 days. 15 tablet 6  . aspirin-acetaminophen-caffeine (EXCEDRIN MIGRAINE) 250-250-65 MG tablet Take by mouth every 6 (six) hours as needed for headache.    . metroNIDAZOLE (FLAGYL) 500 MG tablet Take 1 tablet (500 mg total) by mouth 2 (two) times daily. 14 tablet 0  . ondansetron (ZOFRAN) 4 MG tablet Take 1 tablet (4 mg total) by mouth every 6 (six) hours. 12 tablet 0   No current facility-administered medications for this visit.    No Known Allergies    Review of Systems:  Constitutional:  No  fever, no chills, No recent illness   HEENT: +headache, no vision change, no hearing change, No sore throat, No  sinus pressure  Cardiac: No  chest pain, No  pressure, No palpitations, No  Orthopnea  Respiratory:  No  shortness of breath. No  Cough  Gastrointestinal: No  abdominal pain, +nausea, No  vomiting,  No  blood in stool, No  diarrhea, No  constipation   Musculoskeletal: +new myalgia/arthralgia  Genitourinary: No  incontinence, No  abnormal genital bleeding, No abnormal genital discharge  Skin: No  Rash  Endocrine: No cold intolerance,  No heat intolerance  Neurologic: No  weakness, No  dizziness, No  slurred speech/focal weakness/facial droop  Psychiatric: +concerns with depression, +concerns with anxiety, No sleep problems, No mood problems  Exam:  BP 128/78   Pulse 84    Ht 5\' 7"  (1.702 m)   Wt 207 lb (93.9 kg)   BMI 32.42 kg/m   Constitutional: VS see above. General Appearance: alert, well-developed, well-nourished, NAD  Eyes: Normal lids and conjunctive, non-icteric sclera. EOMI, PERRLA. No nystagmus.   Ears, Nose, Mouth, Throat: MMM, Normal external inspection ears/nares/mouth/lips/gums.  Neck: No masses, trachea midline. No thyroid enlargement.   Respiratory: Normal respiratory effort. no wheeze, no rhonchi, no rales  Cardiovascular: S1/S2 normal, no murmur, no rub/gallop auscultated. RRR.  Gastrointestinal: Nontender, no masses.    Musculoskeletal: Gait normal. No clubbing/cyanosis of digits. Strength 5 out of 5 in all extremities.  Neurological: Normal balance/coordination. No tremor. No cranial nerve deficit on limited exam. Motor and sensation intact and symmetric. Cerebellar reflexes intact.   Skin: warm, dry, intact. No rash/ulcer.   Psychiatric: Normal judgment/insight. Anxious/depressed mood and affect at points of the encounter. Oriented x3.      ASSESSMENT/PLAN:   New daily persistent headache - unilateral symptoms and character c/w more likely migraine vs possible tension headache. New onset severe in otherwise healthy person --> MRI/MRA - Plan: MR BRAIN W WO CONTRAST, MR MRA HEAD WO CONTRAST, dexamethasone (DECADRON) injection 4 mg, metoCLOPramide (REGLAN) 10 mg in dextrose 5 % 50 mL IVPB, ketorolac (TORADOL) injection 30 mg, DISCONTINUED: ketorolac (TORADOL) 30 MG/ML injection 30 mg, DISCONTINUED: metoCLOPramide (REGLAN) 10 mg in dextrose 5 % 50 mL IVPB    Patient Instructions  Plan:  We are going to schedule an MRI to evaluate brain and blood vessels  We are giving medications in the office today to help with pain  If pain worsens, or if you develop weakness or speech deficits, severe dizziness, vision changes or other concerning symptoms, please have someone take you to nearest ER or call 911.   We are going to try a  prescription for Imitrex which, if taken at first sign of headache, can stop a migraine. See prescription bottle for full instructions.   Let's follow up in the office to recheck headache issues and further address other concerns with stress    Visit summary with medication list and pertinent instructions was printed for patient to review. All questions at time of visit were answered - patient instructed to contact office with any additional concerns. ER/RTC precautions were reviewed with the patient. Follow-up plan: Return in about 1 week (around 11/30/2016) for review MRI results, discuss stress issues, see me or call sooner if needed.  Note: Total time spent 45 minutes, greater than 50% of the visit was spent face-to-face counseling and coordinating care for the following: The encounter diagnosis was New daily persistent headache..Marland Kitchen

## 2016-11-23 NOTE — Patient Instructions (Signed)
Plan:  We are going to schedule an MRI to evaluate brain and blood vessels  We are giving medications in the office today to help with pain  If pain worsens, or if you develop weakness or speech deficits, severe dizziness, vision changes or other concerning symptoms, please have someone take you to nearest ER or call 911.   We are going to try a prescription for Imitrex which, if taken at first sign of headache, can stop a migraine. See prescription bottle for full instructions.   Let's follow up in the office to recheck headache issues and further address other concerns with stress

## 2016-11-24 ENCOUNTER — Ambulatory Visit (HOSPITAL_COMMUNITY)
Admission: RE | Admit: 2016-11-24 | Discharge: 2016-11-24 | Disposition: A | Discharge status: Home / Self Care | Payer: 59 | Source: Ambulatory Visit | Attending: Osteopathic Medicine | Admitting: Osteopathic Medicine

## 2016-11-24 DIAGNOSIS — G4452 New daily persistent headache (NDPH): Secondary | ICD-10-CM | POA: Diagnosis not present

## 2016-11-24 DIAGNOSIS — R51 Headache: Secondary | ICD-10-CM | POA: Diagnosis not present

## 2016-11-24 MED ORDER — GADOBENATE DIMEGLUMINE 529 MG/ML IV SOLN
20.0000 mL | Freq: Once | INTRAVENOUS | Status: AC | PRN
  Administered 2016-11-24: 20 mL via INTRAVENOUS

## 2016-11-24 MED FILL — ACYCLOVIR 400 MG TABLET: 400 | 5 days supply | Qty: 15 | Fill #1

## 2016-11-29 ENCOUNTER — Inpatient Hospital Stay: Admission: RE | Admit: 2016-11-29 | Payer: 59 | Source: Ambulatory Visit

## 2016-12-01 ENCOUNTER — Encounter: Payer: Self-pay | Admitting: Osteopathic Medicine

## 2016-12-01 ENCOUNTER — Ambulatory Visit (INDEPENDENT_AMBULATORY_CARE_PROVIDER_SITE_OTHER): Payer: 59 | Admitting: Osteopathic Medicine

## 2016-12-01 VITALS — BP 122/78 | HR 84 | Ht 67.0 in | Wt 203.6 lb

## 2016-12-01 DIAGNOSIS — F419 Anxiety disorder, unspecified: Secondary | ICD-10-CM | POA: Diagnosis not present

## 2016-12-01 DIAGNOSIS — G4452 New daily persistent headache (NDPH): Secondary | ICD-10-CM | POA: Diagnosis not present

## 2016-12-01 DIAGNOSIS — F329 Major depressive disorder, single episode, unspecified: Secondary | ICD-10-CM

## 2016-12-01 DIAGNOSIS — F32A Depression, unspecified: Secondary | ICD-10-CM

## 2016-12-01 DIAGNOSIS — F430 Acute stress reaction: Secondary | ICD-10-CM | POA: Diagnosis not present

## 2016-12-01 MED ORDER — SERTRALINE HCL 25 MG PO TABS: 25.0000 mg | ORAL_TABLET | Freq: Every day | ORAL | 1 refills | Status: AC

## 2016-12-01 MED FILL — SERTRALINE HCL 25 MG TABLET: 25 | 30 days supply | Qty: 30 | Fill #0

## 2016-12-01 NOTE — Patient Instructions (Signed)
We are starting a medication today called Zoloft to help treat your anxiety and depression. This is a daily medication to help control your symptoms. A therapist can also coach you in techniques to recognize and deal with troubling thought patterns and behaviors. The ability to cope with external stressors is crucial to overall mental health.   I will call or message you in the next 2 weeks: If doing well on the medicine but not feeling any effect, we can increase the dose. If you're starting to feel some effect/improvement, we can hold off on a dose increase and reevaluate at your office visit.   Let's plan to follow up in the office in 4 weeks. At that time, we can talk about how well the medicine is working for you, and we can consider increasing the dose, adding another medicine, etc.   If you experience problematic side effects, please let me know ASAP - we can switch the medicine any time, and we don't need an appointment for this.   Any questions or concerns, call me!

## 2016-12-01 NOTE — Progress Notes (Signed)
HPI: Lisa Norman is a 36 y.o. female  who presents to [facility] today, 12/01/16,  for chief complaint of: No chief complaint on file.   Headaches - doing a bit better with the Excedrin Migraine, has not had to use the Imitrex yet. . Location: R side, never bilateral, extending down into the back  . Quality: pressure, no throbbing  . Duration: started 10/24/16 (see urgent care notes)  . Timing: more frequent recently over he past few weeks. Lingers a few days when it happens . Modifying factors: Excedrin has helped  . Assoc signs/symptoms:  +photphobia w. Headache. Severe stress at work    Stress - (+)PHQ9. 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 Was recruited here, struggling with the move given how stressful work has been. Has noticed significantly increased irritability, sleep problems, mood swings. Has been going on premature since she moved here, less than 6 months and no previous history of anxiety/depression problems but is having difficulty coping, has been unable to go to work for some time due to headaches and anxiety.   Paperwork for [employer] - requests completion of this - was not available at time of last visit but I have it today and reviewed it with the patient.     Past medical, surgical, social and family history reviewed: Patient Active Problem List   Diagnosis Date Noted  . New daily persistent headache 11/23/2016  . 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 STD exposure 10/26/2016  . Vaginal discharge 10/26/2016  . Vulvar ulcer 10/26/2016  . Skin lesion 10/26/2016  . Menorrhagia with regular cycle 03/01/2016  . Dysmenorrhea 03/01/2016  . History of ELISA positive for herpes simplex virus 03/01/2016  . History of PID 03/01/2016  . Screening examination for STD (sexually transmitted disease)  03/01/2016  . Pelvic pain 03/01/2016  . Class 1 obesity without serious comorbidity with body mass index (BMI) of 33.0 to 33.9 in adult 03/01/2016  . Contraceptive surveillance 03/01/2016  . Atypical chest pain 12/22/2014   Past Surgical History:  Procedure Laterality Date  . APPENDECTOMY    . LAPAROSCOPY    . WISDOM TOOTH EXTRACTION     Social History  Substance Use Topics  . Smoking status: Former Smoker    Types: Cigarettes  . Smokeless tobacco: Never Used  . Alcohol use No   Family History  Problem Relation Age of Onset  . Stroke Mother   . Diabetes Brother   . Breast cancer Neg Hx   . Ovarian cancer Neg Hx   . Colon cancer Neg Hx   . Heart disease Neg Hx      Current medication list and allergy/intolerance information reviewed:   Current Outpatient Prescriptions  Medication Sig Dispense Refill  . acyclovir (ZOVIRAX) 400 MG tablet Take 1 tablet (400 mg total) by mouth 3 (three) times daily. For 5 days. (Patient taking differently: Take 400 mg by mouth 3 (three) times daily as needed (outbreaks). For 5 days.) 15 tablet 6  . aspirin-acetaminophen-caffeine (EXCEDRIN MIGRAINE) 250-250-65 MG tablet Take 2 tablets by mouth daily as needed for migraine.    Marland Kitchen. ibuprofen (ADVIL,MOTRIN) 200 MG tablet Take 600 mg by mouth every 8 (eight) hours as needed for moderate pain or cramping.    . naproxen sodium (ANAPROX) 220 MG tablet Take 440 mg by mouth 2 (two) times daily as needed.    . SUMAtriptan (  IMITREX) 100 MG tablet Take 0.5-1 tablets (50-100 mg total) by mouth every 2 (two) hours as needed for migraine. May repeat in 2 hours if headache persists or recurs. Max dose per day 200 mg. Call Dr. If needing more than 4 times per month 20 tablet 3   No current facility-administered medications for this visit.    No Known Allergies    Review of Systems:  Constitutional:  No  fever, no chills, No recent illness   HEENT: +headache, no vision change, no hearing change, No sore throat,  No  sinus pressure  Cardiac: No  chest pain, No  pressure, No palpitations  Respiratory:  No  shortness of breath. No  Cough  Gastrointestinal: No  abdominal pain  Neurologic: No  weakness, No  dizziness, No  slurred speech/focal weakness/facial droop  Psychiatric: +concerns with depression, +concerns with anxiety, +sleep problems, +mood problems  Exam:  BP 122/78   Pulse 84   Ht 5\' 7"  (1.702 m)   Wt 203 lb 9.6 oz (92.4 kg)   SpO2 100%   BMI 31.89 kg/m   Constitutional: VS see above. General Appearance: alert, well-developed, well-nourished, NAD  Eyes: Normal lids and conjunctive, non-icteric sclera. EOMI, PERRLA. No nystagmus.   Ears, Nose, Mouth, Throat: MMM, Normal external inspection ears/nares/mouth/lips/gums.  Neck: No masses, trachea midline. No thyroid enlargement.   Respiratory: Normal respiratory effort. no wheeze, no rhonchi, no rales  Cardiovascular: S1/S2 normal, no murmur, no rub/gallop auscultated. RRR.  Gastrointestinal: Nontender, no masses.    Musculoskeletal: Gait normal. No clubbing/cyanosis of digits. Strength 5 out of 5 in all extremities.  Neurological: Normal balance/coordination. No tremor. No cranial nerve deficit on limited exam. Motor and sensation intact and symmetric. Cerebellar reflexes intact.   Skin: warm, dry, intact. No rash/ulcer.   Psychiatric: Normal judgment/insight. Anxious/depressed mood and affect at points of the encounter. Oriented x3.   MRI/MRA results reviewed in detail with the patient, normal.   ASSESSMENT/PLAN:   New daily persistent headache - Overall improved, reassured that MRI is normal. Okay to continue Excedrin as needed, headaches overall better. Would like to hold off on prophylactic medication  Stress reaction - Significant stress/anxiety, work-related, difficulty coping with stressors. Patient has counseling appointment in place. - Plan: sertraline (ZOLOFT) 25 MG tablet  Anxiety and depression - Plan:  sertraline (ZOLOFT) 25 MG tablet    Patient Instructions  We are starting a medication today called Zoloft to help treat your anxiety and depression. This is a daily medication to help control your symptoms. A therapist can also coach you in techniques to recognize and deal with troubling thought patterns and behaviors. The ability to cope with external stressors is crucial to overall mental health.   I will call or message you in the next 2 weeks: If doing well on the medicine but not feeling any effect, we can increase the dose. If you're starting to feel some effect/improvement, we can hold off on a dose increase and reevaluate at your office visit.   Let's plan to follow up in the office in 4 weeks. At that time, we can talk about how well the medicine is working for you, and we can consider increasing the dose, adding another medicine, etc.   If you experience problematic side effects, please let me know ASAP - we can switch the medicine any time, and we don't need an appointment for this.   Any questions or concerns, call me!      Visit summary with medication  list and pertinent instructions was printed for patient to review. All questions at time of visit were answered - patient instructed to contact office with any additional concerns. ER/RTC precautions were reviewed with the patient. Follow-up plan: Return in about 4 weeks (around 12/29/2016) for recheck on anti-depressant/anti-anxiety medications, sooner if needed.

## 2016-12-02 DIAGNOSIS — F32A Depression, unspecified: Secondary | ICD-10-CM | POA: Insufficient documentation

## 2016-12-02 DIAGNOSIS — F419 Anxiety disorder, unspecified: Secondary | ICD-10-CM | POA: Insufficient documentation

## 2016-12-02 DIAGNOSIS — F329 Major depressive disorder, single episode, unspecified: Secondary | ICD-10-CM | POA: Insufficient documentation

## 2016-12-05 ENCOUNTER — Ambulatory Visit: Admission: RE | Admit: 2016-12-05 | Payer: 59 | Source: Ambulatory Visit | Admitting: Obstetrics and Gynecology

## 2016-12-05 ENCOUNTER — Encounter: Admission: RE | Payer: Self-pay | Source: Ambulatory Visit

## 2016-12-05 SURGERY — LAPAROSCOPY, DIAGNOSTIC
Anesthesia: General

## 2016-12-07 MED FILL — SUMATRIPTAN SUCC 100 MG TAB: 100 | 30 days supply | Qty: 18 | Fill #0

## 2016-12-13 ENCOUNTER — Encounter: Payer: Self-pay | Admitting: Osteopathic Medicine

## 2016-12-13 MED ORDER — TOPIRAMATE ER 25 MG PO SPRINKLE CAP24: EXTENDED_RELEASE_CAPSULE | ORAL | 0 refills | Status: DC

## 2016-12-16 ENCOUNTER — Telehealth: Payer: Self-pay | Admitting: Osteopathic Medicine

## 2016-12-16 NOTE — Telephone Encounter (Signed)
Hasn't started the Topiramate - waiting on the prior auth (likely d/t the ER formulation) we can try IR if she hasn't heard back by next week  Doing a bit better with the antidepressants, still feels unable to go back to work, [employer] extended.

## 2016-12-16 NOTE — Telephone Encounter (Signed)
-----   Message from Sunnie NielsenNatalie Kemal Amores, DO sent at 12/02/2016  1:01 PM EDT ----- Regarding: anxiety Started Zoloft, call check in

## 2016-12-26 ENCOUNTER — Ambulatory Visit (INDEPENDENT_AMBULATORY_CARE_PROVIDER_SITE_OTHER): Payer: 59 | Admitting: Osteopathic Medicine

## 2016-12-26 VITALS — BP 123/84 | HR 83 | Ht 67.0 in | Wt 205.0 lb

## 2016-12-26 DIAGNOSIS — B009 Herpesviral infection, unspecified: Secondary | ICD-10-CM

## 2016-12-26 DIAGNOSIS — F329 Major depressive disorder, single episode, unspecified: Secondary | ICD-10-CM

## 2016-12-26 DIAGNOSIS — G44219 Episodic tension-type headache, not intractable: Secondary | ICD-10-CM | POA: Diagnosis not present

## 2016-12-26 DIAGNOSIS — F32A Depression, unspecified: Secondary | ICD-10-CM

## 2016-12-26 DIAGNOSIS — F430 Acute stress reaction: Secondary | ICD-10-CM | POA: Diagnosis not present

## 2016-12-26 DIAGNOSIS — F419 Anxiety disorder, unspecified: Secondary | ICD-10-CM

## 2016-12-26 MED ORDER — METRONIDAZOLE 0.75 % VA GEL: 1.0000 | Freq: Every day | VAGINAL | 0 refills | Status: DC

## 2016-12-26 MED FILL — metroNIDAZOLE 0.75 % GEL: 0.75 | 5 days supply | Qty: 70 | Fill #0

## 2016-12-26 NOTE — Progress Notes (Signed)
HPI: Lisa Norman is a 36 y.o. female  who presents to [facility] today, 12/26/16,  for chief complaint of:  Chief Complaint  Patient presents with  . 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 Not taking Zoloft every day - she has been nervous to take daily medications, does not want to believe that she needs to be dependent on these.  Follow-up headaches: Imitrex causes nausea, the Topamax was not approved by her insurance. She states overall since has been out of work for a bed, stress levels have improved and she is reassured but normal imaging but headaches, when they bother her, are significant.  HSV: She had some blood testing and later culture of active lesion which was positive. She's never had a severe outbreak, she was surprised by this diagnosis especially in light of new relationship, her partner has apparently test negative, she is not sure how exactly she may have been exposed to this and it is causing her relationship a great deal of strain, they are currently in counseling together.      Past medical history, surgical history, social history and family history reviewed.  Patient Active Problem List   Diagnosis Date Noted  . Anxiety and depression 12/02/2016  . New daily persistent headache 11/23/2016  . 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 STD exposure 10/26/2016  . Vaginal discharge 10/26/2016  . Vulvar ulcer 10/26/2016  . Skin lesion 10/26/2016  . Menorrhagia with regular cycle 03/01/2016  . Dysmenorrhea 03/01/2016  . History of ELISA positive for herpes simplex virus 03/01/2016  . History of PID 03/01/2016  . Screening examination for STD (sexually transmitted disease) 03/01/2016  .  Pelvic pain 03/01/2016  . Class 1 obesity without serious comorbidity with body mass index (BMI) of 33.0 to 33.9 in adult 03/01/2016  . Contraceptive surveillance 03/01/2016  . Atypical chest pain 12/22/2014    Current medication list and allergy/intolerance information reviewed.   Current Outpatient Prescriptions on File Prior to Visit  Medication Sig Dispense Refill  . acyclovir (ZOVIRAX) 400 MG tablet Take 1 tablet (400 mg total) by mouth 3 (three) times daily. For 5 days. (Patient taking differently: Take 400 mg by mouth 3 (three) times daily as needed (outbreaks). For 5 days.) 15 tablet 6  . aspirin-acetaminophen-caffeine (EXCEDRIN MIGRAINE) 250-250-65 MG tablet Take 2 tablets by mouth daily as needed for migraine.    Marland Kitchen. ibuprofen (ADVIL,MOTRIN) 200 MG tablet Take 600 mg by mouth every 8 (eight) hours as needed for moderate pain or cramping.    . naproxen sodium (ANAPROX) 220 MG tablet Take 440 mg by mouth 2 (two) times daily as needed.    . sertraline (ZOLOFT) 25 MG tablet Take 1 tablet (25 mg total) by mouth daily. 30 tablet 1  . SUMAtriptan (IMITREX) 100 MG tablet Take 0.5-1 tablets (50-100 mg total) by mouth every 2 (two) hours as needed for migraine. May repeat in 2 hours if headache persists or recurs. Max dose per day 200 mg. Call Dr. If needing more than 4 times per month 20 tablet 3  . Topiramate ER 25 MG CS24 Take 25 mg (1 tablet) po qhs x1 week, then increase to 50 mg (2 tablet) po qhs x1 week and follow-up with Dr. Lyn HollingsheadAlexander  30 each 0   No current facility-administered medications on file prior to visit.    No Known Allergies    Review of Systems:  Constitutional: No recent illness, physically feels well today   HEENT: +less frequent headache, no vision change  Cardiac: No  chest pain, No  pressure, No palpitations  Respiratory:  No  shortness of breath. No  Cough  Gastrointestinal: No  abdominal pain  Musculoskeletal: No new myalgia/arthralgia  Skin: No   Rash  Psychiatric: +concerns with depression, +concerns with anxiety  Exam:  BP 123/84   Pulse 83   Ht 5\' 7"  (1.702 m)   Wt 205 lb (93 kg)   BMI 32.11 kg/m   Constitutional: VS see above. General Appearance: alert, well-developed, well-nourished, NAD  Eyes: Normal lids and conjunctive, non-icteric sclera  Ears, Nose, Mouth, Throat: MMM, Normal external inspection ears/nares/mouth/lips/gums.  Neck: No masses, trachea midline.   Respiratory: Normal respiratory effort. no wheeze, no rhonchi, no rales  Cardiovascular: S1/S2 normal, no murmur, no rub/gallop auscultated. RRR.   Musculoskeletal: Gait normal. Symmetric and independent movement of all extremities  Neurological: Normal balance/coordination. No tremor.  Skin: warm, dry, intact.   Psychiatric: Normal judgment/insight. Normal mood and affect. Oriented x3.     Depression screen PHQ 2/9 11/23/2016  Decreased Interest 3  Down, Depressed, Hopeless 2  PHQ - 2 Score 5  Altered sleeping 3  Tired, decreased energy 3  Change in appetite 3  Feeling bad or failure about yourself  3  Trouble concentrating 2  Moving slowly or fidgety/restless 0  Suicidal thoughts 0  PHQ-9 Score 19      ASSESSMENT/PLAN: The primary encounter diagnosis was Stress reaction. Diagnoses of Anxiety and depression, Episodic tension-type headache, not intractable, and HSV-2 (herpes simplex virus 2) infection were also pertinent to this visit.   Patient understandably is having difficulty coping with recent diagnosis of HSV as well as major life change with new relationship and new job not going very well.   I think we should try to take things one step at a time, tackle the anxiety/depression/mood issues by daily use of Zoloft and continuation of counseling.   Her headaches are doing a good deal better though not resolved. Since she is nervous to take too many medications, I think we can hold off for now on prophylactic medicines for headache,  continue Excedrin Migraine as needed. Give Zoloft little bit of time to work.   Also answered questions on HSV infection with her and her partner. Could consider suppressive therapy though she has never had frequent or severe outbreaks but this would certainly be an option that may ease their minds and reduce risk of transmission. She will think about this as well as headache prophylactic medications  Will follow-up in another 4-6 weeks to check up on how Zoloft is doing, sooner if needed.   Follow-up plan: Return in about 4 weeks (around 01/23/2017) for recheck headaches .  Visit summary with medication list and pertinent instructions was printed for patient to review, alert us if any changes needed. All questions at time of visit were answered - patient instructed to contact office with any additional concerns. ER/RTC precautions were reviewed with the patient and understanding verbalized.   Note: Total time spent 25 minutes, greater than 50% of the visit was spent face-to-face counseling and coordinating care for the following: The primary encounter diagnosis was Stress reaction. Diagnoses of Anxiety and depression, Episodic tension-type headache, not intractable, and HSV-2 (herpes simplex virus  2) infection were also pertinent to this visit.Marland Kitchen

## 2016-12-27 MED FILL — FLUCONAZOLE 150 MG TABLET: 150 | 1 days supply | Qty: 1 | Fill #1

## 2016-12-27 MED FILL — ACYCLOVIR 400 MG TABLET: 400 | 5 days supply | Qty: 15 | Fill #2

## 2016-12-29 ENCOUNTER — Ambulatory Visit: Payer: 59 | Admitting: Osteopathic Medicine

## 2017-01-16 ENCOUNTER — Encounter: Payer: Self-pay | Admitting: Osteopathic Medicine

## 2017-01-23 ENCOUNTER — Ambulatory Visit (INDEPENDENT_AMBULATORY_CARE_PROVIDER_SITE_OTHER): Payer: 59 | Admitting: Osteopathic Medicine

## 2017-01-23 ENCOUNTER — Encounter: Payer: Self-pay | Admitting: Osteopathic Medicine

## 2017-01-23 VITALS — BP 119/74 | HR 77 | Ht 67.0 in | Wt 205.0 lb

## 2017-01-23 DIAGNOSIS — F329 Major depressive disorder, single episode, unspecified: Secondary | ICD-10-CM

## 2017-01-23 DIAGNOSIS — G44219 Episodic tension-type headache, not intractable: Secondary | ICD-10-CM | POA: Diagnosis not present

## 2017-01-23 DIAGNOSIS — F32A Depression, unspecified: Secondary | ICD-10-CM

## 2017-01-23 DIAGNOSIS — F430 Acute stress reaction: Secondary | ICD-10-CM | POA: Diagnosis not present

## 2017-01-23 DIAGNOSIS — B009 Herpesviral infection, unspecified: Secondary | ICD-10-CM

## 2017-01-23 DIAGNOSIS — F419 Anxiety disorder, unspecified: Secondary | ICD-10-CM | POA: Diagnosis not present

## 2017-01-23 MED ORDER — VALACYCLOVIR HCL 500 MG PO TABS: 500.0000 mg | ORAL_TABLET | Freq: Every day | ORAL | 3 refills | Status: AC

## 2017-01-23 MED FILL — VALACYCLOVIR HCL 500 MG TAB: 500 | 90 days supply | Qty: 90 | Fill #0

## 2017-01-23 NOTE — Progress Notes (Signed)
HPI: Lisa Norman is a 36 y.o. female  who presents to [facility] today, 01/23/17,  for chief complaint of:  Chief Complaint  Patient presents with  . Follow-up    headaches    Anxiety: started about a month ago, Doing well on current dose, noticing a bit less anxiety though she is still quite nervous about going back to work. Work situation was quite stressful for her and likely cause of tension-type headaches. She has been interviewing for different jobs elsewhere.  HSV: Recent outbreak, she is on acyclovir as needed for outbreaks but would like to discuss suppressive therapy to prevent transmission to partner.  Headaches: Typically fairly well-controlled with over-the-counter Excedrin Migraine needed. She would like to hold off on trying prophylactic medications at this point such as Topamax, amitriptyline, propranolol. She thinks that anxiety is mostly to blame for the headaches and would like to work on getting this under better control.  Would like clearance to go back to work next week      Past medical history, surgical history, social history and family history reviewed.  Patient Active Problem List   Diagnosis Date Noted  . Anxiety and depression 12/02/2016  . New daily persistent headache 11/23/2016  . 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 STD exposure 10/26/2016  . Vaginal discharge 10/26/2016  . Vulvar ulcer 10/26/2016  . Skin lesion 10/26/2016  . Menorrhagia with regular cycle 03/01/2016  . Dysmenorrhea 03/01/2016  . History of ELISA positive for herpes simplex virus 03/01/2016  . History of PID 03/01/2016  . Screening examination for STD (sexually transmitted disease) 03/01/2016  . Pelvic pain 03/01/2016  . Class 1 obesity without serious comorbidity with body mass index (BMI) of 33.0 to 33.9 in adult 03/01/2016  . Contraceptive surveillance 03/01/2016  . Atypical chest pain 12/22/2014    Current  medication list and allergy/intolerance information reviewed.   Current Outpatient Prescriptions on File Prior to Visit  Medication Sig Dispense Refill  . acyclovir (ZOVIRAX) 400 MG tablet Take 1 tablet (400 mg total) by mouth 3 (three) times daily. For 5 days. (Patient taking differently: Take 400 mg by mouth 3 (three) times daily as needed (outbreaks). For 5 days.) 15 tablet 6  . aspirin-acetaminophen-caffeine (EXCEDRIN MIGRAINE) 250-250-65 MG tablet Take 2 tablets by mouth daily as needed for migraine.    Marland Kitchen ibuprofen (ADVIL,MOTRIN) 200 MG tablet Take 600 mg by mouth every 8 (eight) hours as needed for moderate pain or cramping.    . naproxen sodium (ANAPROX) 220 MG tablet Take 440 mg by mouth 2 (two) times daily as needed.    . sertraline (ZOLOFT) 25 MG tablet Take 1 tablet (25 mg total) by mouth daily. 30 tablet 1   No current facility-administered medications on file prior to visit.    No Known Allergies    Review of Systems:  Constitutional: No recent illness  HEENT: +headache, no vision change  Cardiac: No  chest pain, No  pressure  Respiratory:  No  shortness of breath.   Skin: No  Rash - HSV outbreak resolving  Psychiatric: No  concerns with depression, +concerns with anxiety  Exam:  BP 119/74   Pulse 77   Ht 5\' 7"  (1.702 m)   Wt 205 lb (93 kg)   BMI 32.11 kg/m   Constitutional: VS see above. General Appearance: alert, well-developed, well-nourished, NAD  Eyes: Normal lids and conjunctive, non-icteric sclera  Ears, Nose, Mouth, Throat: MMM, Normal external inspection  ears/nares/mouth/lips/gums.  Neck: No masses, trachea midline.   Respiratory: Normal respiratory effort.   Musculoskeletal: Gait normal. Symmetric and independent movement of all extremities  Neurological: Normal balance/coordination. No tremor.  Skin: warm, dry, intact.   Psychiatric: Normal judgment/insight. Normal mood and affect. Oriented x3.         ASSESSMENT/PLAN:   HSV-2  (herpes simplex virus 2) infection - Plan: valACYclovir (VALTREX) 500 MG tablet  Stress reaction - Has been in counseling, feels okay to go back to work next week  Anxiety and depression - Advised we can increase dose of Zoloft versus keep as is versus add/augment versus psychiatry referral. Would like to stay the course for now  Episodic tension-type headache, not intractable - Would like to defer prophylactic medications for now, may see if this plan changes if headaches return with one back to work    Patient Instructions  Plan:  Continue Zoloft Consider increased dose if needed  Start Valtrex for suppression If any more outbreaks, let me know   If decide want to be on prevention for headaches, let me know     Follow-up plan: Return in about 3 months (around 04/25/2017) for recehck on Zoloft, sooner if needed .  Visit summary with medication list and pertinent instructions was printed for patient to review, alert [place] if any changes needed. All questions at time of visit were answered - patient instructed to contact office with any additional concerns. ER/RTC precautions were reviewed with the patient and understanding verbalized.   Note: Total time spent 25 minutes, greater than 50% of the visit was spent face-to-face counseling and coordinating care for the following: The primary encounter diagnosis was HSV-2 (herpes simplex virus 2) infection. Diagnoses of Stress reaction, Anxiety and depression, and Episodic tension-type headache, not intractable were also pertinent to this visit.Marland Kitchen

## 2017-01-23 NOTE — Patient Instructions (Signed)
Plan:  Continue Zoloft Consider increased dose if needed  Start Valtrex for suppression If any more outbreaks, let me know   If decide want to be on prevention for headaches, let me know

## 2017-01-27 ENCOUNTER — Telehealth: Payer: Self-pay | Admitting: Osteopathic Medicine

## 2017-01-27 ENCOUNTER — Encounter: Payer: Self-pay | Admitting: Osteopathic Medicine

## 2017-01-27 NOTE — Telephone Encounter (Signed)
Pt requested call.  [place] decided to move back to [place]. I asked her to let me know if anything else I can do for her, at this time no additional questions/concerns, nothing needed from me. Will be sorry to lose her as a patient but understandably she has to do what is best for herself and her mental health.

## 2017-02-03 ENCOUNTER — Encounter: Payer: Self-pay | Admitting: Obstetrics and Gynecology

## 2017-02-07 ENCOUNTER — Other Ambulatory Visit: Payer: Self-pay

## 2017-02-07 MED ORDER — FLUCONAZOLE 150 MG PO TABS: 150.0000 mg | ORAL_TABLET | Freq: Every day | ORAL | 0 refills | Status: AC

## 2017-02-07 MED FILL — FLUCONAZOLE 150 MG TABLET: 150 | 1 days supply | Qty: 1 | Fill #0

## 2017-02-08 ENCOUNTER — Other Ambulatory Visit: Payer: Self-pay | Admitting: Obstetrics and Gynecology

## 2017-02-25 IMAGING — DX DG CHEST 2V
2 series · 2 of 2 positions shown · non-contrast
Comparison: None.

CLINICAL DATA: Productive cough

EXAM:
CHEST  2 VIEW

[chest pa]
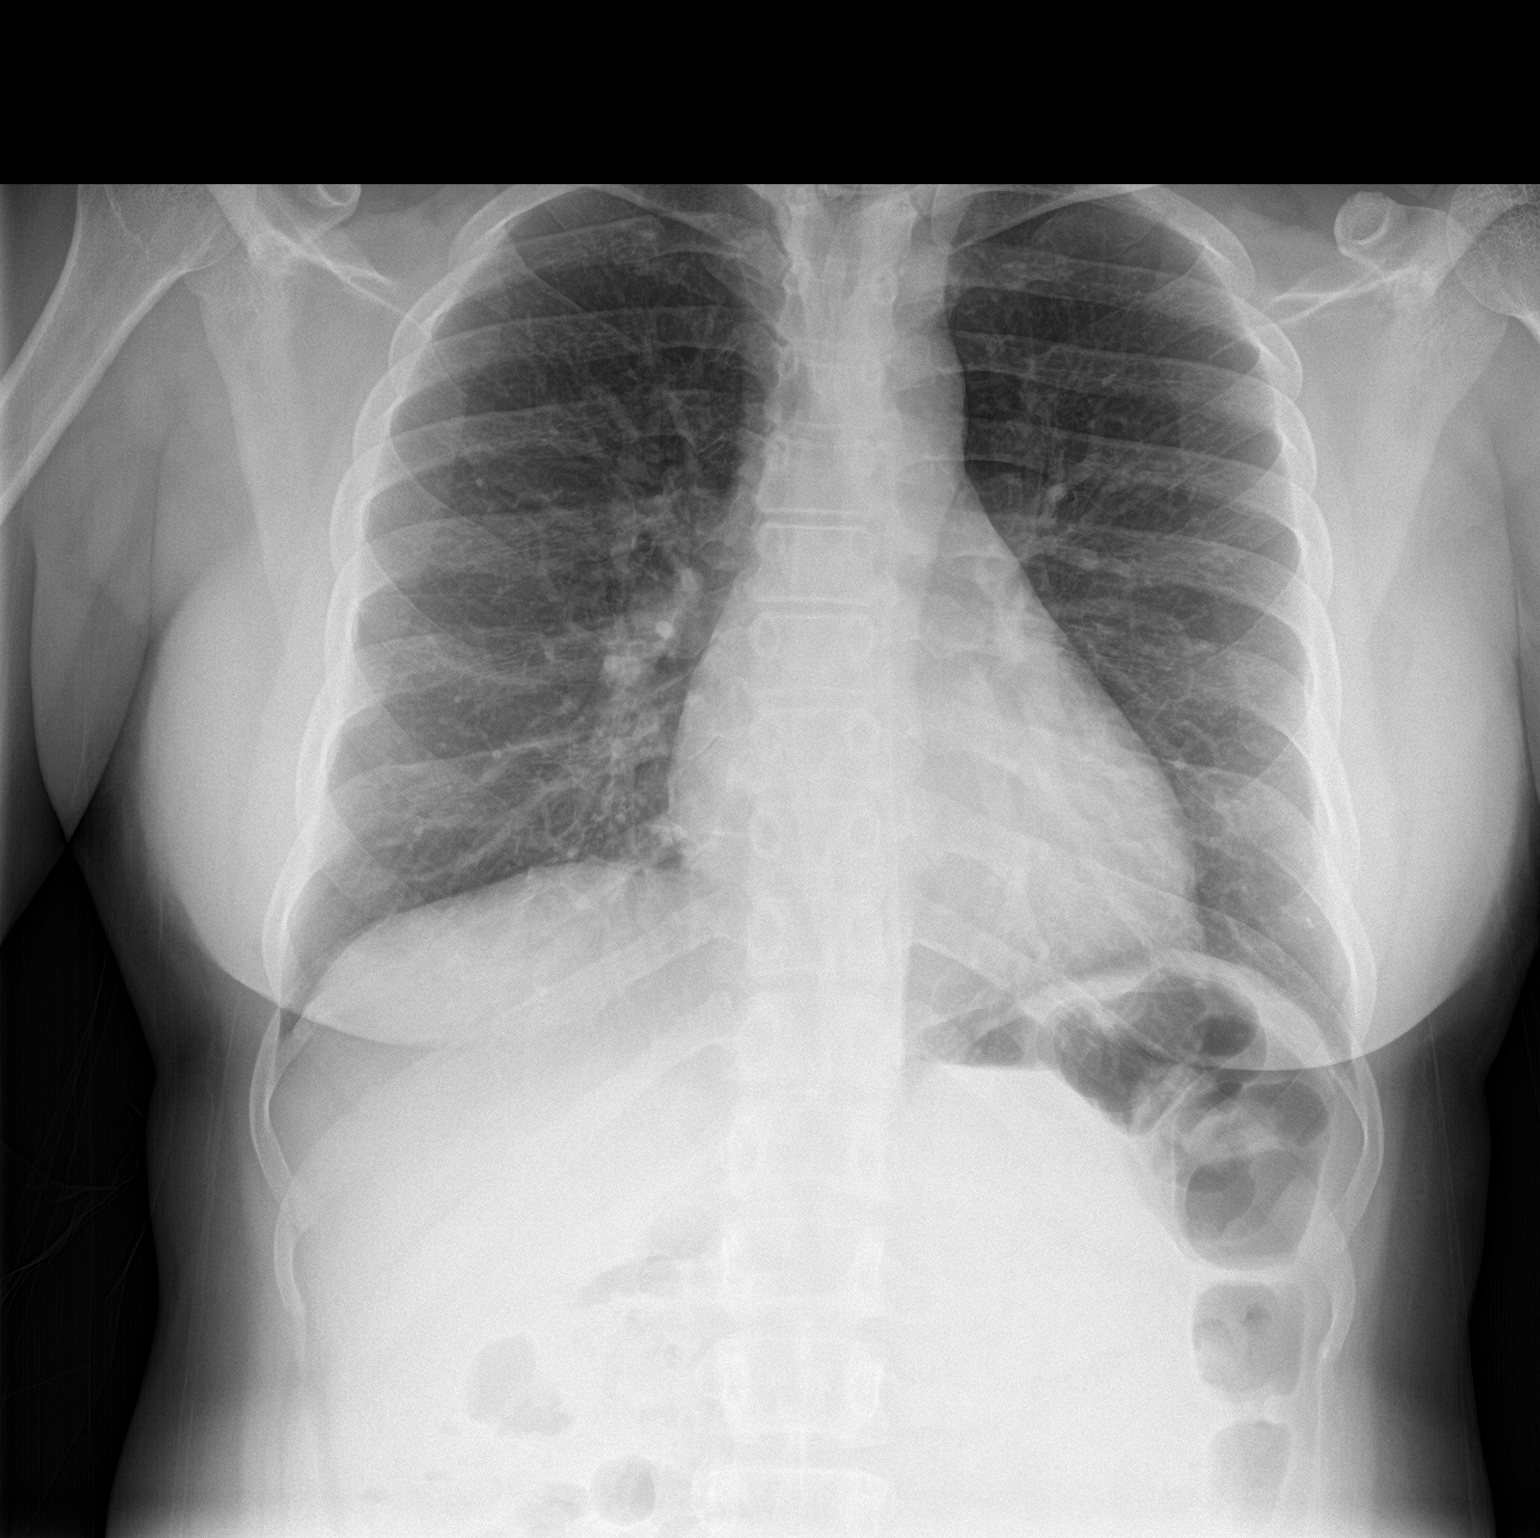

[chest lat]
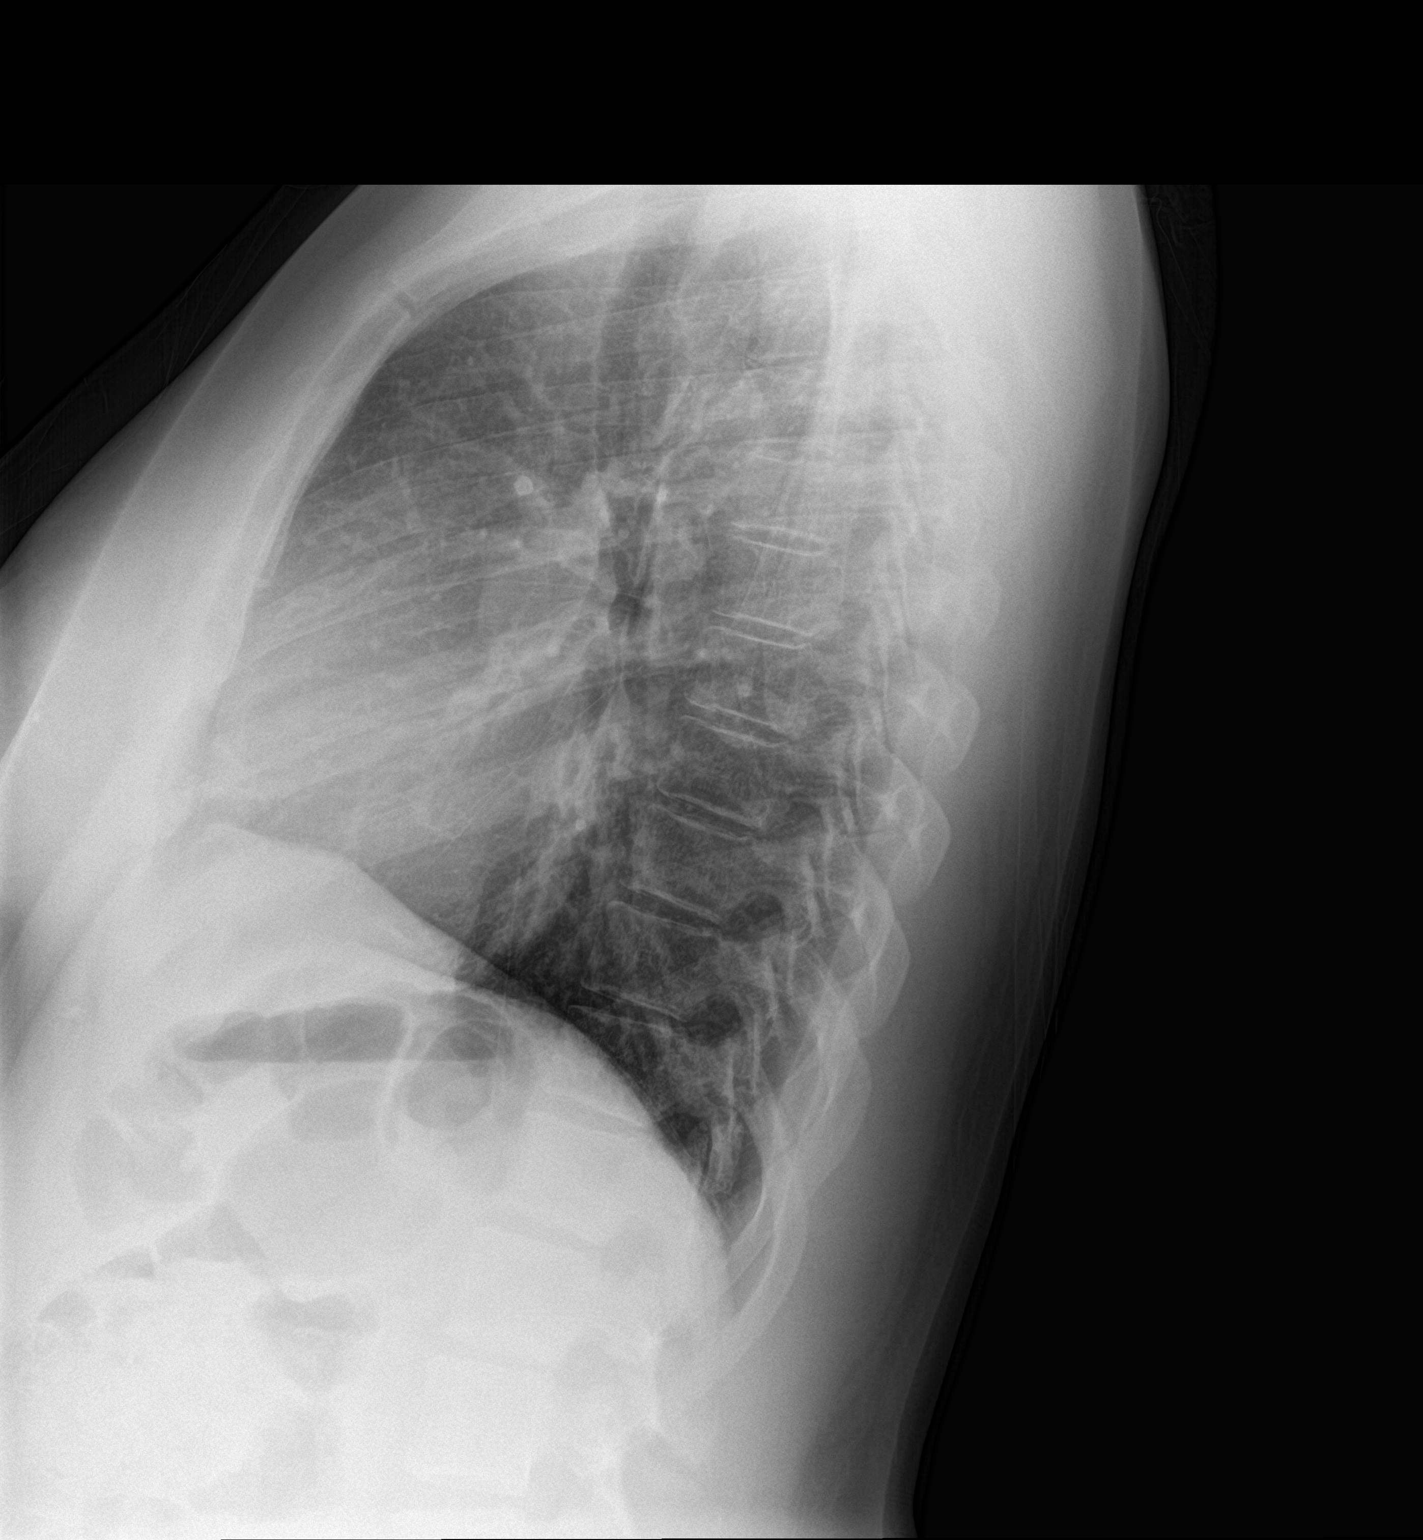

[2 of 2 positions shown; findings below may reference images not displayed]

FINDINGS: The heart size and mediastinal contours are within normal limits.
Both lungs are clear. The visualized skeletal structures are
unremarkable.
IMPRESSION: No active cardiopulmonary disease.

## 2017-04-26 ENCOUNTER — Ambulatory Visit: Payer: 59 | Admitting: Osteopathic Medicine

## 2017-04-26 DIAGNOSIS — Z0189 Encounter for other specified special examinations: Secondary | ICD-10-CM
